# Patient Record
Sex: Female | Born: 1980 | Race: Black or African American | Hispanic: No | Marital: Married | Smoking: Former smoker
Health system: Southern US, Community
[De-identification: ages and names within clinical notes are randomized; demographics above are authoritative.]

## PROBLEM LIST (undated history)

## (undated) DIAGNOSIS — R7303 Prediabetes: Secondary | ICD-10-CM

## (undated) DIAGNOSIS — J811 Chronic pulmonary edema: Secondary | ICD-10-CM

## (undated) DIAGNOSIS — K219 Gastro-esophageal reflux disease without esophagitis: Secondary | ICD-10-CM

## (undated) DIAGNOSIS — M1990 Unspecified osteoarthritis, unspecified site: Secondary | ICD-10-CM

## (undated) DIAGNOSIS — M779 Enthesopathy, unspecified: Secondary | ICD-10-CM

## (undated) DIAGNOSIS — I10 Essential (primary) hypertension: Secondary | ICD-10-CM

## (undated) DIAGNOSIS — J189 Pneumonia, unspecified organism: Secondary | ICD-10-CM

## (undated) DIAGNOSIS — D649 Anemia, unspecified: Secondary | ICD-10-CM

## (undated) HISTORY — PX: CLOSED REDUCTION FINGER FRACTURE: SUR218

## (undated) SURGERY — Surgical Case
Anesthesia: *Unknown

---

## 1999-10-11 ENCOUNTER — Encounter: Payer: Self-pay | Admitting: Emergency Medicine

## 1999-10-11 ENCOUNTER — Emergency Department (HOSPITAL_COMMUNITY): Admission: EM | Admit: 1999-10-11 | Discharge: 1999-10-11 | Payer: Self-pay | Admitting: Emergency Medicine

## 2002-06-10 ENCOUNTER — Other Ambulatory Visit: Admission: RE | Admit: 2002-06-10 | Discharge: 2002-06-10 | Payer: Self-pay | Admitting: Gynecology

## 2002-11-13 ENCOUNTER — Inpatient Hospital Stay (HOSPITAL_COMMUNITY): Admission: AD | Admit: 2002-11-13 | Discharge: 2002-11-13 | Payer: Self-pay | Admitting: Gynecology

## 2002-12-16 ENCOUNTER — Inpatient Hospital Stay (HOSPITAL_COMMUNITY): Admission: AD | Admit: 2002-12-16 | Discharge: 2002-12-20 | Payer: Self-pay | Admitting: Gynecology

## 2002-12-18 ENCOUNTER — Encounter (INDEPENDENT_AMBULATORY_CARE_PROVIDER_SITE_OTHER): Payer: Self-pay

## 2003-01-29 ENCOUNTER — Other Ambulatory Visit: Admission: RE | Admit: 2003-01-29 | Discharge: 2003-01-29 | Payer: Self-pay | Admitting: Gynecology

## 2006-02-08 ENCOUNTER — Emergency Department (HOSPITAL_COMMUNITY): Admission: EM | Admit: 2006-02-08 | Discharge: 2006-02-08 | Payer: Self-pay | Admitting: Emergency Medicine

## 2006-09-11 ENCOUNTER — Inpatient Hospital Stay (HOSPITAL_COMMUNITY): Admission: AD | Admit: 2006-09-11 | Discharge: 2006-09-11 | Payer: Self-pay | Admitting: Obstetrics and Gynecology

## 2007-04-04 ENCOUNTER — Inpatient Hospital Stay (HOSPITAL_COMMUNITY): Admission: RE | Admit: 2007-04-04 | Discharge: 2007-04-07 | Payer: Self-pay | Admitting: Obstetrics and Gynecology

## 2007-04-11 ENCOUNTER — Inpatient Hospital Stay (HOSPITAL_COMMUNITY): Admission: AD | Admit: 2007-04-11 | Discharge: 2007-04-12 | Payer: Self-pay | Admitting: Obstetrics and Gynecology

## 2007-07-03 ENCOUNTER — Emergency Department (HOSPITAL_COMMUNITY): Admission: EM | Admit: 2007-07-03 | Discharge: 2007-07-03 | Payer: Self-pay | Admitting: Emergency Medicine

## 2008-03-31 ENCOUNTER — Emergency Department (HOSPITAL_COMMUNITY): Admission: EM | Admit: 2008-03-31 | Discharge: 2008-03-31 | Payer: Self-pay | Admitting: Emergency Medicine

## 2009-07-09 ENCOUNTER — Ambulatory Visit: Payer: Self-pay | Admitting: Family

## 2009-07-09 ENCOUNTER — Inpatient Hospital Stay (HOSPITAL_COMMUNITY): Admission: AD | Admit: 2009-07-09 | Discharge: 2009-07-09 | Payer: Self-pay | Admitting: Obstetrics & Gynecology

## 2009-08-20 ENCOUNTER — Inpatient Hospital Stay (HOSPITAL_COMMUNITY): Admission: AD | Admit: 2009-08-20 | Discharge: 2009-08-20 | Payer: Self-pay | Admitting: Obstetrics and Gynecology

## 2010-05-15 ENCOUNTER — Encounter: Payer: Self-pay | Admitting: Obstetrics & Gynecology

## 2010-05-25 ENCOUNTER — Other Ambulatory Visit: Payer: Self-pay | Admitting: Family Medicine

## 2010-05-25 DIAGNOSIS — E049 Nontoxic goiter, unspecified: Secondary | ICD-10-CM

## 2010-05-27 ENCOUNTER — Other Ambulatory Visit: Payer: Self-pay

## 2010-05-27 ENCOUNTER — Ambulatory Visit
Admission: RE | Admit: 2010-05-27 | Discharge: 2010-05-27 | Disposition: A | Discharge status: Home / Self Care | Payer: BC Managed Care – PPO | Source: Ambulatory Visit | Attending: Family Medicine | Admitting: Family Medicine

## 2010-05-27 DIAGNOSIS — E049 Nontoxic goiter, unspecified: Secondary | ICD-10-CM

## 2010-07-12 LAB — ABO/RH: ABO/RH(D): O POS

## 2010-07-12 LAB — CBC
HCT: 26 % — ABNORMAL LOW (ref 36.0–46.0)
MCHC: 33.3 g/dL (ref 30.0–36.0)
WBC: 10.8 10*3/uL — ABNORMAL HIGH (ref 4.0–10.5)

## 2010-07-17 LAB — URINE MICROSCOPIC-ADD ON

## 2010-07-17 LAB — WET PREP, GENITAL
Clue Cells Wet Prep HPF POC: NONE SEEN
Trich, Wet Prep: NONE SEEN
Yeast Wet Prep HPF POC: NONE SEEN

## 2010-07-17 LAB — URINALYSIS, ROUTINE W REFLEX MICROSCOPIC
Bilirubin Urine: NEGATIVE
Glucose, UA: NEGATIVE mg/dL
Ketones, ur: NEGATIVE mg/dL

## 2010-07-17 LAB — CBC
HCT: 31.2 % — ABNORMAL LOW (ref 36.0–46.0)
MCHC: 32.5 g/dL (ref 30.0–36.0)
WBC: 6.1 10*3/uL (ref 4.0–10.5)

## 2010-07-17 LAB — HCG, QUANTITATIVE, PREGNANCY: hCG, Beta Chain, Quant, S: 2331 m[IU]/mL — ABNORMAL HIGH (ref <5)

## 2010-07-17 LAB — GC/CHLAMYDIA PROBE AMP, GENITAL
Chlamydia, DNA Probe: NEGATIVE
GC Probe Amp, Genital: NEGATIVE

## 2010-07-17 LAB — POCT PREGNANCY, URINE: Preg Test, Ur: POSITIVE

## 2010-09-06 NOTE — Discharge Summary (Signed)
[name], Cheyenne Lyons NO.:  [id number]   MEDICAL RECORD NO.:  [id number]          PATIENT TYPE:  INP   LOCATION:  9106                          FACILITY:  [facility]   PHYSICIAN:  Gerrit Friends. Aldona Bar, M.D.   DATE OF BIRTH:  10-18-1980   DATE OF ADMISSION:  04/04/2007  DATE OF DISCHARGE:  04/07/2007                               DISCHARGE SUMMARY   DISCHARGE DIAGNOSES:  1. Term pregnancy delivered, 7-pound 8-ounce female infant, Apgars 8 and      9.  2. Blood type O positive.  3. Previous cesarean section.  4. Intra-abdominal adhesions.  5. Desire for elective sterilization.   PROCEDURE:  Repeat cesarean section and tubal sterilization procedure.   HISTORY:  This 30 year old gravida 2, now para 2 was admitted on  April 04, 2007, for repeat cesarean section and a tubal sterilization  procedure, having gone through a relatively uncomplicated pregnancy.  She had a previous cesarean section with her first pregnancy and was  desirous of a repeat and a permanent elective sterilization procedure.   Intraoperatively, omental adhesions were noted and there were lysed, and  the patient was delivered of a 7-pound 8-ounce female infant with Apgars  of 8 and 9.  Her tubal sterilization was unremarkable.  Her  postoperative course was benign.  Her discharge hemoglobin was 9.7 with  a white count of 6400 and a platelet count of 262,000.  On the morning  of December 14 she was ambulating well, tolerating a regular diet well,  her wound was clean and dry, she was breast-feeding and bottle-feeding,  vital signs were normal, and she was desirous of discharge.  Accordingly, her staples were removed and her wound was Steri-Stripped  with Benzoin.  She was given all appropriate instructions per discharge  brochure.  Discharge medications include vitamins - one a day, Feosol  capsules - one daily, Motrin 600 mg every 6 hours as needed for cramping  or pain, and Tylox one to two every 4-6 hours  as needed for more severe  pain.  She will return the office followup in approximately four weeks'  time or as needed.   CONDITION ON DISCHARGE:  Improved.      Gerrit Friends. Aldona Bar, M.D.  Electronically Signed     RMW/MEDQ  D:  04/07/2007  T:  04/08/2007  Job:  [id number]

## 2010-09-06 NOTE — Op Note (Signed)
NAMERAYVON, BRANDVOLD NO.:  [id number]   MEDICAL RECORD NO.:  [id number]          PATIENT TYPE:  INP   LOCATION:  9106                          FACILITY:  [facility]   PHYSICIAN:  Randye Lobo, M.D.   DATE OF BIRTH:  [birth year]   DATE OF PROCEDURE:  04/04/2007  DATE OF DISCHARGE:                               OPERATIVE REPORT   PREOPERATIVE DIAGNOSIS:  1. Intrauterine gestation at 99 plus 6 weeks.  2. History of prior cesarean section.  3. Unfavorable cervix.   POSTOPERATIVE DIAGNOSIS:  1. Intrauterine gestation at 44 plus 6 weeks.  2. History of prior cesarean section.  3. Unfavorable cervix.  4. Omental/abdominal wall adhesions.   PROCEDURE:  Repeat low segment transverse cesarean section with lysis of  adhesions.   SURGEON:  Conley Simmonds, MD.   ASSISTANT:  Miguel Aschoff, MD.   ANESTHESIA:  Spinal.   ESTIMATED BLOOD LOSS:  800 mL.   COMPLICATIONS:  None.   INDICATIONS FOR PROCEDURE:  The patient is a 30 year old, para 1,  African-American female with a history of a prior cesarean section who  presents now at term with an unfavorable cervix. A plan is made to  proceed with a repeat cesarean section after risks, benefits, and  alternatives are reviewed.   FINDINGS:  A viable female was delivered with Apgars of 8 at 1 minute and  9 at 5 minutes.  The newborn is vigorous at birth.  The placenta has a  normal insertion of a three-vessel cord.  The uterus, tubes and ovaries  are unremarkable.  There are adhesions between the omentum and the  anterior abdominal wall which are completely lysed.   SPECIMENS:  None.   PROCEDURE:  The patient was reidentified in the preoperative hold area.  The patient received Ancef 1 gram IV for antibiotic prophylaxis.  In the  operating room, a spinal anesthetic was administered.  The patient was  placed in the supine position with a left lateral tilt.  The abdomen was  sterilely prepped and a Foley catheter sterilely placed  inside the  bladder.  The patient was then sterilely draped.   A Pfannenstiel incision is created sharply with a scalpel along the line  of the patient's previous incision. The incision is carried down to the  fascia using monopolar cautery for hemostasis.  The fascia was then  incised in the midline with a scalpel and the incision was extended  bilaterally with the Mayo scissors.  The rectus muscles were dissected  off of the fascia superiorly and inferiorly.  Omentum was encountered  during the dissection of the rectus muscles superiorly.  The parietal  peritoneum was entered sharply after it was elevated with two hemostat  clamps.  The peritoneal incision was extended cranially and caudally.   Omental adhesions were encountered which were lysed with a combination  of sharp dissection and with monopolar cautery for hemostasis.   The lower uterine segment was exposed and the bladder retractor was  used.  The bladder flap was then sharply created.  A transverse lower  uterine segment incision was  created sharply with a scalpel and the  incision was extended bilaterally in an upward fashion using a bandage  scissors.  Membranes were ruptured with an Allis clamp.  The vertex was  elevated to the uterine incision.  Fundal pressure was applied and the  vertex and a Mityvac was then applied to the fetal vertex.  Proper  pressure was applied and there was dystocia noted from the anterior  abdominal wall at this time.  A bandage scissors was then used to  partially bisect the rectus muscles bilaterally.  This then allowed  delivery of the fetal vertex using the Mityvac.  The nares and mouth  were suctioned.  The remainder of the newborn was delivered.  The  newborn was noted to be vigorous.  The umbilical cord was doubly clamped  and cut and the newborn was carried over to the awaiting pediatricians.   The placenta was then manually extracted.  The uterus was exteriorized  at this time and  was wiped clean with a moistened lap pad.  The uterus  was closed with a single layer closure of #1 chromic which was performed  in a running locked layer.  The inferior aspect of the lower uterine  segment was noted to be quite thin and a double layer closure of the #1  chromic could therefore not to be performed.  There was some bleeding  noted along the mid portion of the incision and a series of figure-of-  eight sutures of #1 chromic were used to create hemostasis.   The uterus was then returned to the peritoneal cavity at this time which  was irrigated and suctioned.  The uterine incision was reexamined and  was noted to be hemostatic.   The omental adhesions to the abdominal wall peritoneum were then  addressed with double clamping and sharply dividing some segments of the  peritoneum and then using free ties of 2-0 Vicryl.  The remainder of the  adhesions were lysed using monopolar cautery which created good  hemostasis.   With complete lysis of adhesions, the anterior abdominal wall was then  closed.  2-0 Vicryl was used to close the parietal peritoneum by placing  the suture in a running fashion.  The rectus muscles were reconstructed  using figure-of-eight sutures of #0 Vicryl.  The rectus muscles were  then brought together in the midline using interrupted sutures of #0  Vicryl.  The fascia was closed by placing a running suture of #0 Vicryl.  The subcutaneous layer was irrigated and suctioned and made hemostatic  with monopolar cautery.  The skin was closed with staples and a sterile  bandage was placed over the incision.   This concluded the patient's procedure.  There were no complications.  All needle, instrument, and sponge counts were correct.  The patient is  escorted to the recovery room in stable and awake condition.      Randye Lobo, M.D.  Electronically Signed    BES/MEDQ  D:  04/05/2007  T:  04/07/2007  Job:  [id number]

## 2010-09-06 NOTE — Op Note (Signed)
Cheyenne Lyons, Cheyenne Lyons NO.:  Lyons   MEDICAL RECORD NO.:  Lyons          PATIENT TYPE:  EMS   LOCATION:  ED                           FACILITY:  [facility]   PHYSICIAN:  Cheyenne Ano. Gramig, Cheyenne LyonsDATE OF BIRTH:  1980-09-24   DATE OF PROCEDURE:  03/31/2008  DATE OF DISCHARGE:                               OPERATIVE REPORT   HISTORY OF PRESENT ILLNESS:  Cheyenne Lyons is a very pleasant 30 year old  female who presents to [facility] Emergency Room today, March 31, 2008 after sustaining a crush injury to the left middle finger, distal  tip.  She states that this was caught in her car door earlier this  morning while dropping her child off to school.  She presented to the  emergency room for evaluation.  We were contacted by the emergency room  physicians for hand consult, given the nature of her injury.   The patient's tetanus shot was updated in the emergency room.   Examination of the left middle finger showed she had significant soft  tissue swelling, partial nail plate avulsion, 100% subungual hematoma  and deformity present.  Her radiographs revealed a comminuted distal-tip  fracture, extra-articular, about the left middle finger.  There was a  prior left index finger distal tip fracture she sustained in 2007, which  was noted radiographically.   We discussed with the patient at length, given the nature of her upper  extremity predicament and open fracture, the need for I and D, and  repair of structures as necessary.  After obtaining verbal consent, the  patient underwent:  1)  Digital block, left middle finger.  2)  I and D  of skin, subcutaneous tissue, and bone.  3)  Treatment of an open  fracture, left middle finger distal tip.  4)  Nail plate removal.  5)  Nail bed repair.   The patient tolerated the procedure well.  There were no complications.   We have discussed diligent elevation, keeping her dressings clean, dry,  and intact at all times and not  removing these.  We have dispensed  Vicodin 5/500 1-2 p.o. q.4-6h. p.r.n. pain to take exclusive of the  Hycodan that she is finishing for influenza-type symptoms.  In addition,  we have discussed taking Keflex for antibiotic prophylaxis.  This will  be 500 mg 1 p.o. q.i.d., Peri-Colace 100 mg 1 p.o. b.i.d., and vitamin C  500 mg 1 p.o. b.i.d.   We have asked her to follow up with our therapist initially for splint  application in approximately 1 week's time.  She will call [phone] to  see them.  She will follow up with Dr. Amanda Lyons in approximately 10 days  to 2 weeks.  She is to call [phone] for an appointment, questions, or  concerns.  All questions were encouraged and answered.     Cheyenne Lyons, P.A.-C.      Cheyenne Lyons, M.D.  Electronically Signed   BB/MEDQ  D:  03/31/2008  T:  03/31/2008  Job:  [id number]

## 2010-09-09 NOTE — Op Note (Signed)
NAME:  Cheyenne Lyons, Cheyenne Lyons                       ACCOUNT NO.:  [id number]   MEDICAL RECORD NO.:  [id number]                   PATIENT TYPE:  INP   LOCATION:  9133                                 FACILITY:  [facility]   PHYSICIAN:  Ivor Costa. Farrel Gobble, M.D.              DATE OF BIRTH:  1981/02/21   DATE OF PROCEDURE:  12/17/2002  DATE OF DISCHARGE:                                 OPERATIVE REPORT   PREOPERATIVE DIAGNOSIS:  Arrest of dilation and descent, recurrent  variables.   POSTOPERATIVE DIAGNOSIS:  Arrest of dilation and descent, recurrent  variables, occult cord.   PROCEDURE:  Primary cesarean section, low flap, transverse.   SURGEON:  Ivor Costa. Farrel Gobble, M.D.   ASSISTANTMarcial Pacas P. Fontaine, M.D.   ANESTHESIA:  Epidural.   ESTIMATED BLOOD LOSS:  500 mL.   FINDINGS:  Viable female infant acyclitic in a vertex presentation, Apgars 7  and 9, pH 7.27, there was a occult cord around the right shoulder.  Normal  tubes and ovaries.   PATHOLOGY:  Placenta.   PROCEDURE:  The patient was taken to the operating room and placed in the  supine position with left lateral displacement, prepped and draped in the  usual sterile fashion.  After adequate anesthesia was insured, a  Pfannenstiel skin incision was made with the scalpel and carried through the  underlying layer of fascia with electrocautery.  The fascia was scored in  the midline and the incision extended laterally with the Mayo scissors.  The  inferior aspect of the fascial incision was grasped with Kochers, underlying  rectus muscles were dissected off with blunt and sharp dissection.  In a  similar fashion, the superior aspect of the incision was grasped with  Kochers and the underlying rectus muscles were dissected off.  The rectus  muscles were separated in the midline.  The peritoneum was identified and  entered sharply.  The peritoneal incision was then extended superiorly and  inferiorly with good visualization of the  underlying bowel and bladder.  The  bladder blade was inserted.  The vesicouterine peritoneum was identified,  tented up and entered sharply.  The incision was extended laterally, the  bladder flap was created digitally.  The bladder blade was then reinserted  and the lower uterine segment incised in a transverse fashion with a  scalpel.  The infant was delivered from the vertex presentation.  The occult  cord was able to be reduced prior to delivery of the remainder of the  infant.  The cord was clamped and cut and the baby was handed off to the  awaiting pediatrician.  Cord bloods were obtained as was pH.  The uterus was  massaged and the placenta was allowed to separate naturally.  The uterus was  then cleared of all clots and debris.  The uterine incision was repaired  with a running locked layer of 0 chromic and hemostasis was assured.  The  pelvis was then copiously irrigated with warm saline which confirmed  hemostasis of the incision.  The adnexa were inspected and noted to be  unremarkable.  Reinspection underneath the bladder flap, peritoneum and  muscle was also assured.  The peritoneum was closed  with 0 Vicryl in a running fashion.  The subcutaneous tissue was irrigated  and the skin was closed with staples.  The patient tolerated the procedure  well.  Sponge, lap, and needle counts were correct x 2.  She was given Ancef  interoperatively.                                               Ivor Costa. Farrel Gobble, M.D.    THL/MEDQ  D:  12/17/2002  T:  12/17/2002  Job:  [id number]

## 2010-09-09 NOTE — Discharge Summary (Signed)
   NAME:  Cheyenne Lyons, Cheyenne Lyons                       ACCOUNT NO.:  [id number]   MEDICAL RECORD NO.:  [id number]                   PATIENT TYPE:  INP   LOCATION:  9133                                 FACILITY:  [facility]   PHYSICIAN:  Juan H. Lily Peer, M.D.             DATE OF BIRTH:  05-May-1980   DATE OF ADMISSION:  12/16/2002  DATE OF DISCHARGE:  12/20/2002                                 DISCHARGE SUMMARY   DISCHARGE DIAGNOSES:  1. Intrauterine pregnancy 37 weeks, delivered.  2. Arrest of dilatation and descent.  3. Recurrent variable decelerations.  4. Occult cord.  5. Status post primary cesarean section low flap transverse by Dr. Douglass Rivers on December 17, 2002.   HOSPITAL COURSE:  This is a 22-years-of-age female gravida 1 para 0 with an  EDC of January 04, 2003.  Prenatal course had been uncomplicated.   HOSPITAL COURSE:  On December 16, 2002 the patient presented with gross  rupture of membranes since approximately 1 p.m.  Her group B strep was not  available as of yet; therefore the patient was begun on IV antibiotics for  prophylaxis.  Cervix was closed on admission.  The patient was admitted and  monitored.  Cervidil was given that p.m. and Pitocin was started the  following a.m.  Subsequently, however, the patient developed recurrent  variables and the diagnosis of arrest of dilation and descent with recurrent  variables was made and therefore the patient underwent a primary cesarean  section low flat transverse by Dr. Douglass Rivers on December 17, 2002.  There  was found to be an occult cord.  The patient underwent delivery of a female,  Apgars of 7 and 9, weight of 7 pounds 6 ounces.  Postpartum the patient  remained afebrile, voiding, stable condition.  She was discharged to home on  December 20, 2002 and given [facility] Gy[facility] postpartum instructions and  postpartum booklet.   ACCESSORY CLINICAL FINDINGS/LABORATORY DATA:  The patient is O positive,  rubella immune   On December 18, 2002 hemoglobin was 9.4.   DISPOSITION:  The patient is discharged home.  Given a prescription for  Tylox to take p.r.n. pain, iron daily.  Follow up in six weeks.     Susa Loffler, P.A.                    Juan H. Lily Peer, M.D.    TSG/MEDQ  D:  01/09/2003  T:  01/10/2003  Job:  [id number]

## 2010-09-09 NOTE — H&P (Signed)
   NAME:  Cheyenne Lyons                       ACCOUNT NO.:  [id number]   MEDICAL RECORD NO.:  [id number]                   PATIENT TYPE:  INP   LOCATION:  9164                                 FACILITY:  [facility]   PHYSICIAN:  Timothy P. Fontaine, M.D.           DATE OF BIRTH:  Aug 23, 1980   DATE OF ADMISSION:  12/16/2002  DATE OF DISCHARGE:                                HISTORY & PHYSICAL   CHIEF COMPLAINT:  Ruptured membranes.   HISTORY OF PRESENT ILLNESS:  This is a 30 year old, G1, P0 at [redacted] weeks  gestation who enters with a history of gross rupture of membranes since  approximately 1 p.m.  She is not having any significant contractions.  Her  prenatal course has been uncomplicated.  For remainder of her history, see  her [place].   PHYSICAL EXAMINATION:  HEENT:  Normal.  LUNGS:  Clear.  CARDIAC:  Regular rate.  No rubs, murmurs or gallops.  ABDOMEN:  Gravid, vertex fetus.  Positive fetal heart tones.  PELVIC:  Gross rupture of membranes.  Fluid is clear.  Cervix is long and  closed.  Vertex presentation on bimanual.   ASSESSMENT/PLAN:  This is a 37 week, gross rupture of membranes, no overt  labor, cervix is closed.  We will admit and monitor.  If without overt  labor, we will begin induction with Cervidil in the p.m. and Pitocin in the  a.m.  The patient's beta Streptococcus culture was done yesterday.  Given  that it is not available yet and as an anticipated prolonged labor, we will  go ahead and cover with antibiotic prophylaxis.  The plan was discussed with  the patient who understood and accepted.                                               Timothy P. Audie Box, M.D.    TPF/MEDQ  D:  12/16/2002  T:  12/16/2002  Job:  [id number]

## 2010-09-26 ENCOUNTER — Other Ambulatory Visit: Payer: Self-pay | Admitting: Obstetrics and Gynecology

## 2011-01-27 LAB — URIC ACID: Uric Acid, Serum: 9 — ABNORMAL HIGH

## 2011-01-27 LAB — COMPREHENSIVE METABOLIC PANEL
ALT: 18
AST: 21
Albumin: 2.8 — ABNORMAL LOW
CO2: 26
Calcium: 9.4
Creatinine, Ser: 0.82
GFR calc non Af Amer: >60
Glucose, Bld: 90
Potassium: 4.4
Sodium: 142

## 2011-01-27 LAB — URINE CULTURE: Colony Count: >=100000

## 2011-01-27 LAB — URINALYSIS, ROUTINE W REFLEX MICROSCOPIC
Bilirubin Urine: NEGATIVE
Glucose, UA: NEGATIVE
Ketones, ur: NEGATIVE
Nitrite: NEGATIVE
Urobilinogen, UA: 0.2
pH: 6

## 2011-01-27 LAB — DIFFERENTIAL
Basophils Absolute: 0
Basophils Relative: 0
Eosinophils Relative: 2
Neutro Abs: 5.1

## 2011-01-27 LAB — CBC
MCHC: 33.6
RBC: 3.55 — ABNORMAL LOW

## 2011-01-27 LAB — LACTATE DEHYDROGENASE: LDH: 261 — ABNORMAL HIGH

## 2011-01-30 LAB — CBC
HCT: 32.1 — ABNORMAL LOW
Hemoglobin: 10.9 — ABNORMAL LOW
MCHC: 33.7
MCV: 91.5
Platelets: 262
RDW: 13.9
WBC: 7.4

## 2011-01-30 LAB — RPR: RPR Ser Ql: NONREACTIVE

## 2011-01-30 LAB — COMPREHENSIVE METABOLIC PANEL
Alkaline Phosphatase: 114
BUN: 5 — ABNORMAL LOW
Chloride: 108
Creatinine, Ser: 0.59
GFR calc non Af Amer: >60
Glucose, Bld: 75
Potassium: 3.6
Total Bilirubin: 0.3

## 2011-01-30 LAB — URINALYSIS, ROUTINE W REFLEX MICROSCOPIC
Bilirubin Urine: NEGATIVE
Ketones, ur: NEGATIVE
Specific Gravity, Urine: 1.01
Urobilinogen, UA: 0.2
pH: 6.5

## 2011-01-30 LAB — URINE MICROSCOPIC-ADD ON

## 2011-01-30 LAB — LACTATE DEHYDROGENASE: LDH: 128

## 2011-04-23 ENCOUNTER — Ambulatory Visit (INDEPENDENT_AMBULATORY_CARE_PROVIDER_SITE_OTHER): Payer: BC Managed Care – PPO

## 2011-04-23 DIAGNOSIS — R1032 Left lower quadrant pain: Secondary | ICD-10-CM

## 2011-11-02 ENCOUNTER — Other Ambulatory Visit: Payer: Self-pay | Admitting: Obstetrics and Gynecology

## 2012-03-06 ENCOUNTER — Encounter (INDEPENDENT_AMBULATORY_CARE_PROVIDER_SITE_OTHER): Payer: Self-pay | Admitting: General Surgery

## 2012-03-08 ENCOUNTER — Ambulatory Visit (INDEPENDENT_AMBULATORY_CARE_PROVIDER_SITE_OTHER): Payer: BC Managed Care – PPO | Admitting: General Surgery

## 2012-03-08 ENCOUNTER — Encounter (INDEPENDENT_AMBULATORY_CARE_PROVIDER_SITE_OTHER): Payer: Self-pay | Admitting: General Surgery

## 2012-03-08 VITALS — BP 152/70 | HR 72 | Temp 97.7°F | Resp 20 | Ht 65.0 in | Wt 337.2 lb

## 2012-03-08 DIAGNOSIS — L905 Scar conditions and fibrosis of skin: Secondary | ICD-10-CM

## 2012-03-08 NOTE — Patient Instructions (Signed)
The small nodule on the left side of your old C-section scar is most likely old scar tissue. It is a little bit tender, but you stated that has been improving. I cannot push this back in, but there is a small chance this could be an early hernia.  We have discussed and advised a CAT scan to investigate this further if you would desire. You have stated she would like to go home and think about whether to do the CAT scan or not.  Please call Dr. Jacinto Halim office if you decides to have the CAT scan. Also call Dr. Jacinto Halim office if this area becomes more painful or enlarged.

## 2012-03-08 NOTE — Progress Notes (Signed)
Patient ID: Cheyenne Lyons, female   DOB: [birth year], 31 y.o.   MRN: [id number]  Chief Complaint  Patient presents with  . New Evaluation    eval LLQ pain    HPI Cheyenne Lyons is a 31 y.o. female.  She is referred by Dr. Henderson Cloud for evaluation of a tender nodule in her old C-section scar.  This patient has had 2 cesarean sections. One year ago she had a vaginal ultrasound and was told that she had an ovarian cyst and was placed on birth control pills. She has a one-year history of a tender lump on the left side of her C-section scar. This is constant, it does not reduce. The tenderness is actually less now than it used to be.  Recent vaginal ultrasound shows no abnormalities according to the patient. She has no history of endometriosis. There are no GI symptoms. HPI  History reviewed. No pertinent past medical history.  Past Surgical History  Procedure Date  . Closed reduction finger fracture   . Cesarean section     x2    Family History  Problem Relation Age of Onset  . Cancer Maternal Aunt     ovarian  . Cancer Maternal Uncle     throat    Social History History  Substance Use Topics  . Smoking status: Current Every Day Smoker -- 0.2 packs/day  . Smokeless tobacco: Not on file  . Alcohol Use: No    No Known Allergies  Current Outpatient Prescriptions  Medication Sig Dispense Refill  . diphenhydrAMINE (BENADRYL) 25 MG tablet Take 25 mg by mouth every 6 (six) hours as needed.      Marland Kitchen ibuprofen (ADVIL,MOTRIN) 100 MG tablet Take 100 mg by mouth every 6 (six) hours as needed.      Marland Kitchen GIANVI 3-0.02 MG tablet         Review of Systems Review of Systems  Constitutional: Negative for fever, chills and unexpected weight change.  HENT: Negative for hearing loss, congestion, sore throat, trouble swallowing and voice change.   Eyes: Negative for visual disturbance.  Respiratory: Negative for cough and wheezing.   Cardiovascular: Negative for chest pain, palpitations and leg  swelling.  Gastrointestinal: Negative for nausea, vomiting, abdominal pain, diarrhea, constipation, blood in stool, abdominal distention and anal bleeding.  Genitourinary: Negative for hematuria, vaginal bleeding and difficulty urinating.  Musculoskeletal: Negative for arthralgias.  Skin: Negative for rash and wound.  Neurological: Negative for seizures, syncope and headaches.  Hematological: Negative for adenopathy. Does not bruise/bleed easily.  Psychiatric/Behavioral: Negative for confusion.    Blood pressure 152/70, pulse 72, temperature 97.7 F (36.5 C), temperature source Temporal, resp. rate 20, height 5\' 5"  (1.651 m), weight 337 lb 3.2 oz (152.953 kg).  Physical Exam Physical Exam  Constitutional: She is oriented to person, place, and time. She appears well-developed and well-nourished. No distress.       BMI 56  HENT:  Head: Normocephalic and atraumatic.  Nose: Nose normal.  Mouth/Throat: No oropharyngeal exudate.  Eyes: Conjunctivae normal and EOM are normal. Pupils are equal, round, and reactive to light. Left eye exhibits no discharge. No scleral icterus.  Neck: Neck supple. No JVD present. No tracheal deviation present. No thyromegaly present.  Cardiovascular: Normal rate, regular rhythm, normal heart sounds and intact distal pulses.   No murmur heard. Pulmonary/Chest: Effort normal and breath sounds normal. No respiratory distress. She has no wheezes. She has no rales. She exhibits no tenderness.  Abdominal: Soft. Bowel sounds are normal. She  exhibits no distension and no mass. There is no tenderness. There is no rebound and no guarding.         Examined supine and standing. Small, 1.5 cm area of tissue thickening on the left-sided directly beneath her transverse Pfannenstiel incision. Not reducible. Minimally tender. Present even when supine No inflammatory changes of the skin.  Musculoskeletal: She exhibits no edema and no tenderness.  Lymphadenopathy:    She has no  cervical adenopathy.  Neurological: She is alert and oriented to person, place, and time. She exhibits normal muscle tone. Coordination normal.  Skin: Skin is warm. No rash noted. She is not diaphoretic. No erythema. No pallor.  Psychiatric: She has a normal mood and affect. Her behavior is normal. Judgment and thought content normal.    Data Reviewed Dr. Kittie Plater offixce notes  Assessment    Tender nodule in Pfannenstiel scar. Suspect this is simply scar tissue or fibrosis. Less likely hernia. Doubt endometrioma. Symptoms have been improving over the past few months No evidence of inguinal hernia Morbid obesity    Plan    I discussed the differential diagnosis with the patient. I advised to CT scan for definitive investigation. She is not sure that she wants to go through a CT scan. She is going to go home and think about that and call me back.  I've asked her to return to see me if this area enlarges or becomes more painful.  Otherwise see me when necessary       Angelia Mould. Derrell Lolling, M.D., [facility] General and Minimally invasive Surgery Breast and Colorectal Surgery Office:   [phone] Pager:   [phone]  03/08/2012, 5:30 PM

## 2012-04-23 ENCOUNTER — Telehealth (INDEPENDENT_AMBULATORY_CARE_PROVIDER_SITE_OTHER): Payer: Self-pay | Admitting: General Surgery

## 2012-04-23 ENCOUNTER — Other Ambulatory Visit (INDEPENDENT_AMBULATORY_CARE_PROVIDER_SITE_OTHER): Payer: Self-pay | Admitting: General Surgery

## 2012-04-23 DIAGNOSIS — L905 Scar conditions and fibrosis of skin: Secondary | ICD-10-CM

## 2012-04-23 NOTE — Telephone Encounter (Signed)
Called patient to advise CT was scheduled with [facility]. Patient given direct number to call in order to set up the appointment based on what works for her schedule. Patient advised to please call back to provide the date she obtained so that I can look out for the results and give to Dr. Derrell Lolling for review. Advised patient that once he reviews he will determine if she needs to come back in to be seen in office or not. Patient agreed.

## 2012-04-25 ENCOUNTER — Ambulatory Visit
Admission: RE | Admit: 2012-04-25 | Discharge: 2012-04-25 | Disposition: A | Discharge status: Home / Self Care | Payer: BC Managed Care – PPO | Source: Ambulatory Visit | Attending: General Surgery | Admitting: General Surgery

## 2012-04-25 MED ORDER — IOHEXOL 300 MG/ML  SOLN
125.0000 mL | Freq: Once | INTRAMUSCULAR | Status: AC | PRN
  Administered 2012-04-25: 125 mL via INTRAVENOUS

## 2012-05-14 ENCOUNTER — Ambulatory Visit (INDEPENDENT_AMBULATORY_CARE_PROVIDER_SITE_OTHER): Payer: BC Managed Care – PPO | Admitting: General Surgery

## 2012-05-14 ENCOUNTER — Encounter (INDEPENDENT_AMBULATORY_CARE_PROVIDER_SITE_OTHER): Payer: Self-pay | Admitting: General Surgery

## 2012-05-14 VITALS — BP 128/72 | HR 74 | Temp 98.4°F | Resp 18 | Ht 65.0 in | Wt 341.0 lb

## 2012-05-14 DIAGNOSIS — R209 Unspecified disturbances of skin sensation: Secondary | ICD-10-CM

## 2012-05-14 DIAGNOSIS — L7682 Other postprocedural complications of skin and subcutaneous tissue: Secondary | ICD-10-CM

## 2012-05-14 NOTE — Patient Instructions (Signed)
The CT scan shows that you do not have a hernia. There is a small area of tissue thickening at the left end of the incision. This may simply be scar tissue from her previous C-section. There is a small chance this could be an endometrioma.  You do not need any surgical intervention for this at this time.  Please return to your gynecologist to discuss the cyclical nature of pain related to this.  Return to see Dr. Derrell Lolling if any further surgical problems arise.

## 2012-05-14 NOTE — Progress Notes (Signed)
Patient ID: Cheyenne Lyons, female   DOB: [birth year], 32 y.o.   MRN: [id number] History this patient underwent CT scanning of her abdomen and pelvis  recently. This was to evaluate a tender nodule in the left end of her C-section scar. The CT scan shows a 1.8 cm area of tissue thickening related to the left end of the C-section scar. There is no hernia. Fibrosis and endometrioma were suggested in the differential. The patient states the pain is not that bad but it does hurt to palpate it. She says it is a little bit worse during her menstrual cycle. She has no history of endometriosis.  Exam: Patient looks well. Husband is with her. Morbidly obese. Pfannenstiel scar well-healed. Slight tenderness and slight thickening at the far left edge of the incision. No hernia detected  Assessment: Incisional pain and Pfannenstiel scar. No evidence of hernia. This may simply be due to fibrosis and scar tissue or less likely, and endometrioma  Plan: There is no indication for general surgical intervention at this point Return to see her gynecologist for discussion of whether this might be an endometrioma or not   Angelia Mould. Derrell Lolling, M.D., [facility] General and Minimally invasive Surgery Breast and Colorectal Surgery Office:   [phone] Pager:   [phone]

## 2013-01-08 ENCOUNTER — Other Ambulatory Visit (HOSPITAL_COMMUNITY): Payer: Self-pay | Admitting: Obstetrics and Gynecology

## 2013-01-08 ENCOUNTER — Other Ambulatory Visit: Payer: Self-pay | Admitting: Obstetrics and Gynecology

## 2013-01-08 DIAGNOSIS — Z3689 Encounter for other specified antenatal screening: Secondary | ICD-10-CM

## 2013-01-08 LAB — OB RESULTS CONSOLE RPR: RPR: NONREACTIVE

## 2013-01-08 LAB — OB RESULTS CONSOLE RUBELLA ANTIBODY, IGM: RUBELLA: IMMUNE

## 2013-01-08 LAB — OB RESULTS CONSOLE ABO/RH: RH Type: POSITIVE

## 2013-01-08 LAB — OB RESULTS CONSOLE ANTIBODY SCREEN: ANTIBODY SCREEN: NEGATIVE

## 2013-01-08 LAB — OB RESULTS CONSOLE HIV ANTIBODY (ROUTINE TESTING): HIV: NONREACTIVE

## 2013-01-08 LAB — OB RESULTS CONSOLE HEPATITIS B SURFACE ANTIGEN: HEP B S AG: NEGATIVE

## 2013-01-24 ENCOUNTER — Ambulatory Visit (HOSPITAL_COMMUNITY)
Admission: RE | Admit: 2013-01-24 | Discharge: 2013-01-24 | Disposition: A | Discharge status: Home / Self Care | Payer: BC Managed Care – PPO | Source: Ambulatory Visit | Attending: Obstetrics and Gynecology | Admitting: Obstetrics and Gynecology

## 2013-01-24 ENCOUNTER — Other Ambulatory Visit (HOSPITAL_COMMUNITY): Payer: Self-pay | Admitting: Obstetrics and Gynecology

## 2013-01-24 DIAGNOSIS — E669 Obesity, unspecified: Secondary | ICD-10-CM | POA: Insufficient documentation

## 2013-01-24 DIAGNOSIS — Z3689 Encounter for other specified antenatal screening: Secondary | ICD-10-CM

## 2013-01-24 DIAGNOSIS — O358XX0 Maternal care for other (suspected) fetal abnormality and damage, not applicable or unspecified: Secondary | ICD-10-CM | POA: Insufficient documentation

## 2013-01-24 DIAGNOSIS — Z363 Encounter for antenatal screening for malformations: Secondary | ICD-10-CM | POA: Insufficient documentation

## 2013-01-24 DIAGNOSIS — O34219 Maternal care for unspecified type scar from previous cesarean delivery: Secondary | ICD-10-CM | POA: Insufficient documentation

## 2013-01-24 DIAGNOSIS — Z1389 Encounter for screening for other disorder: Secondary | ICD-10-CM | POA: Insufficient documentation

## 2013-01-28 ENCOUNTER — Other Ambulatory Visit (HOSPITAL_COMMUNITY): Payer: Self-pay | Admitting: Obstetrics and Gynecology

## 2013-01-28 DIAGNOSIS — O358XX0 Maternal care for other (suspected) fetal abnormality and damage, not applicable or unspecified: Secondary | ICD-10-CM

## 2013-01-28 DIAGNOSIS — O34219 Maternal care for unspecified type scar from previous cesarean delivery: Secondary | ICD-10-CM

## 2013-01-28 DIAGNOSIS — E669 Obesity, unspecified: Secondary | ICD-10-CM

## 2013-03-07 ENCOUNTER — Ambulatory Visit (HOSPITAL_COMMUNITY)
Admission: RE | Admit: 2013-03-07 | Discharge: 2013-03-07 | Disposition: A | Discharge status: Home / Self Care | Payer: BC Managed Care – PPO | Source: Ambulatory Visit | Attending: Obstetrics and Gynecology | Admitting: Obstetrics and Gynecology

## 2013-03-07 ENCOUNTER — Ambulatory Visit (HOSPITAL_COMMUNITY): Admission: RE | Admit: 2013-03-07 | Payer: BC Managed Care – PPO | Source: Ambulatory Visit

## 2013-03-07 DIAGNOSIS — O34219 Maternal care for unspecified type scar from previous cesarean delivery: Secondary | ICD-10-CM | POA: Insufficient documentation

## 2013-03-07 DIAGNOSIS — E669 Obesity, unspecified: Secondary | ICD-10-CM | POA: Insufficient documentation

## 2013-03-07 DIAGNOSIS — O358XX0 Maternal care for other (suspected) fetal abnormality and damage, not applicable or unspecified: Secondary | ICD-10-CM

## 2013-04-12 ENCOUNTER — Ambulatory Visit (INDEPENDENT_AMBULATORY_CARE_PROVIDER_SITE_OTHER): Payer: BC Managed Care – PPO | Admitting: Family Medicine

## 2013-04-12 VITALS — BP 122/82 | HR 100 | Temp 99.1°F | Resp 18 | Ht 66.0 in | Wt 361.0 lb

## 2013-04-12 DIAGNOSIS — R042 Hemoptysis: Secondary | ICD-10-CM

## 2013-04-12 DIAGNOSIS — H9209 Otalgia, unspecified ear: Secondary | ICD-10-CM

## 2013-04-12 DIAGNOSIS — J209 Acute bronchitis, unspecified: Secondary | ICD-10-CM

## 2013-04-12 DIAGNOSIS — H9202 Otalgia, left ear: Secondary | ICD-10-CM

## 2013-04-12 DIAGNOSIS — J029 Acute pharyngitis, unspecified: Secondary | ICD-10-CM

## 2013-04-12 DIAGNOSIS — J329 Chronic sinusitis, unspecified: Secondary | ICD-10-CM

## 2013-04-12 MED ORDER — AZITHROMYCIN 250 MG PO TABS: ORAL_TABLET | ORAL | Status: DC

## 2013-04-12 MED ORDER — HYDROCODONE-HOMATROPINE 5-1.5 MG/5ML PO SYRP: 5.0000 mL | ORAL_SOLUTION | Freq: Three times a day (TID) | ORAL | Status: DC | PRN

## 2013-04-12 NOTE — Progress Notes (Addendum)
Patient ID: Cheyenne Lyons MRN: [id number], DOB: [birth year], 32 y.o. Date of Encounter: 04/12/2013, 1:32 PM  Primary Physician: Loney Laurence, MD  Chief Complaint:  Chief Complaint  Patient presents with   Sore Throat    x7 days    Nasal Congestion   Otalgia    HPI: 32 y.o. year old female presents with 7 day history of gradual onset, gradually worsening sore throat. She also lists a mild productive cough consisting of Hemoptysis, nasal congestion, HA, and otalgia as associated symptoms. She denies rhinorrhea, sinus pressure, fever, chest congestion, or chills. Normal hearing. No GI complaints. Able to swallow saliva, but hurts to do so. Decreased appetite secondary to sore throat. She says her daughter was initially sick, and she believes she developed her symptoms from her. She reports currently being 33 months pregnant.  She currently works at [employer] in Clinical biochemist   History reviewed. No pertinent past medical history.   Home Meds: Prior to Admission medications   Medication Sig Start Date End Date Taking? Authorizing Provider  acetaminophen (TYLENOL) 500 MG tablet Take 500 mg by mouth every 6 (six) hours as needed.   Yes Historical Provider, MD  IRON PO Take by mouth.   Yes Historical Provider, MD  Prenatal Vit-Fe Fumarate-FA (PRENATAL MULTIVITAMIN) TABS tablet Take 1 tablet by mouth daily at 12 noon.   Yes Historical Provider, MD  diphenhydrAMINE (BENADRYL) 25 MG tablet Take 25 mg by mouth every 6 (six) hours as needed.    Historical Provider, MD  Helen Hashimoto 3-0.02 MG tablet  02/28/12   Historical Provider, MD  ibuprofen (ADVIL,MOTRIN) 100 MG tablet Take 100 mg by mouth every 6 (six) hours as needed.    Historical Provider, MD    Allergies: No Known Allergies  History   Social History   Marital Status: Married    Spouse Name: N/A    Number of Children: N/A   Years of Education: N/A   Occupational History   Not on file.   Social History Main Topics     Smoking status: Former Smoker -- 0.25 packs/day   Smokeless tobacco: Not on file   Alcohol Use: No   Drug Use: No   Sexual Activity: Not on file   Other Topics Concern   Not on file   Social History Narrative   No narrative on file     Review of Systems: Constitutional: negative for chills, fever, night sweats or weight changes HEENT: see above Cardiovascular: negative for chest pain or palpitations Respiratory: negative for hemoptysis, wheezing, or shortness of breath Abdominal: negative for abdominal pain, nausea, vomiting or diarrhea Dermatological: negative for rash Neurologic: negative for headache   Physical Exam: Blood pressure 122/82, pulse 100, temperature 99.1 F (37.3 C), temperature source Oral, resp. rate 18, height 5\' 6"  (1.676 m), weight 361 lb (163.749 kg), SpO2 98.00%., Body mass index is 58.29 kg/(m^2). General: Well developed, well nourished, in no acute distress. Head: Normocephalic, atraumatic, eyes without discharge, sclera non-icteric, nares are patent. Bilateral auditory canals clear, TM's are without perforation, pearly grey with reflective cone of light bilaterally. No sinus TTP. Oral cavity moist, dentition normal. Posterior pharynx with post nasal drip and mild erythema. No peritonsillar abscess or tonsillar exudate. Neck: Supple. No thyromegaly. Full ROM. No lymphadenopathy. Lungs: Clear bilaterally to auscultation without wheezes, rales, or rhonchi. Breathing is unlabored. Heart: RRR with S1 S2. No murmurs, rubs, or gallops appreciated. Abdomen: Soft, non-tender, non-distended with normoactive bowel sounds. No hepatomegaly. No rebound/guarding.  No obvious abdominal masses. Msk:  Strength and tone normal for age. Extremities: No clubbing or cyanosis. No edema. Neuro: Alert and oriented X 3. Moves all extremities spontaneously. CNII-XII grossly in tact. Psych:  Responds to questions appropriately with a normal affect.    ASSESSMENT AND PLAN:   32 y.o. year old female with Acute bronchitis  Sinusitis  Acute pharyngitis  Hemoptysis  Otalgia of left ear  z pak and hydromet ordered - -Tylenol/Motrin prn -Rest/fluids -RTC precautions -RTC 3-5 days if no improvement  Signed, Elvina Sidle, MD 04/12/2013 1:32 PM

## 2013-04-12 NOTE — Patient Instructions (Signed)
Sinusitis Sinusitis is redness, soreness, and swelling (inflammation) of the paranasal sinuses. Paranasal sinuses are air pockets within the bones of your face (beneath the eyes, the middle of the forehead, or above the eyes). In healthy paranasal sinuses, mucus is able to drain out, and air is able to circulate through them by way of your nose. However, when your paranasal sinuses are inflamed, mucus and air can become trapped. This can allow bacteria and other germs to grow and cause infection. Sinusitis can develop quickly and last only a short time (acute) or continue over a long period (chronic). Sinusitis that lasts for more than 12 weeks is considered chronic.  CAUSES  Causes of sinusitis include:  Allergies.  Structural abnormalities, such as displacement of the cartilage that separates your nostrils (deviated septum), which can decrease the air flow through your nose and sinuses and affect sinus drainage.  Functional abnormalities, such as when the small hairs (cilia) that line your sinuses and help remove mucus do not work properly or are not present. SYMPTOMS  Symptoms of acute and chronic sinusitis are the same. The primary symptoms are pain and pressure around the affected sinuses. Other symptoms include:  Upper toothache.  Earache.  Headache.  Bad breath.  Decreased sense of smell and taste.  A cough, which worsens when you are lying flat.  Fatigue.  Fever.  Thick drainage from your nose, which often is green and may contain pus (purulent).  Swelling and warmth over the affected sinuses. DIAGNOSIS  Your caregiver will perform a physical exam. During the exam, your caregiver may:  Look in your nose for signs of abnormal growths in your nostrils (nasal polyps).  Tap over the affected sinus to check for signs of infection.  View the inside of your sinuses (endoscopy) with a special imaging device with a light attached (endoscope), which is inserted into your  sinuses. If your caregiver suspects that you have chronic sinusitis, one or more of the following tests may be recommended:  Allergy tests.  Nasal culture A sample of mucus is taken from your nose and sent to a lab and screened for bacteria.  Nasal cytology A sample of mucus is taken from your nose and examined by your caregiver to determine if your sinusitis is related to an allergy. TREATMENT  Most cases of acute sinusitis are related to a viral infection and will resolve on their own within 10 days. Sometimes medicines are prescribed to help relieve symptoms (pain medicine, decongestants, nasal steroid sprays, or saline sprays).  However, for sinusitis related to a bacterial infection, your caregiver will prescribe antibiotic medicines. These are medicines that will help kill the bacteria causing the infection.  Rarely, sinusitis is caused by a fungal infection. In theses cases, your caregiver will prescribe antifungal medicine. For some cases of chronic sinusitis, surgery is needed. Generally, these are cases in which sinusitis recurs more than 3 times per year, despite other treatments. HOME CARE INSTRUCTIONS   Drink plenty of water. Water helps thin the mucus so your sinuses can drain more easily.  Use a humidifier.  Inhale steam 3 to 4 times a day (for example, sit in the bathroom with the shower running).  Apply a warm, moist washcloth to your face 3 to 4 times a day, or as directed by your caregiver.  Use saline nasal sprays to help moisten and clean your sinuses.  Take over-the-counter or prescription medicines for pain, discomfort, or fever only as directed by your caregiver. SEEK IMMEDIATE MEDICAL   CARE IF:  You have increasing pain or severe headaches.  You have nausea, vomiting, or drowsiness.  You have swelling around your face.  You have vision problems.  You have a stiff neck.  You have difficulty breathing. MAKE SURE YOU:   Understand these  instructions.  Will watch your condition.  Will get help right away if you are not doing well or get worse. Document Released: 04/10/2005 Document Revised: 07/03/2011 Document Reviewed: 04/25/2011 ExitCare Patient Information 2014 [employer].   Bronchitis Bronchitis is the body's way of reacting to injury and/or infection (inflammation) of the bronchi. Bronchi are the air tubes that extend from the windpipe into the lungs. If the inflammation becomes severe, it may cause shortness of breath. CAUSES  Inflammation may be caused by:  A virus.  Germs (bacteria).  Dust.  Allergens.  Pollutants and many other irritants. The cells lining the bronchial tree are covered with tiny hairs (cilia). These constantly beat upward, away from the lungs, toward the mouth. This keeps the lungs free of pollutants. When these cells become too irritated and are unable to do their job, mucus begins to develop. This causes the characteristic cough of bronchitis. The cough clears the lungs when the cilia are unable to do their job. Without either of these protective mechanisms, the mucus would settle in the lungs. Then you would develop pneumonia. Smoking is a common cause of bronchitis and can contribute to pneumonia. Stopping this habit is the single most important thing you can do to help yourself. TREATMENT   Your caregiver may prescribe an antibiotic if the cough is caused by bacteria. Also, medicines that open up your airways make it easier to breathe. Your caregiver may also recommend or prescribe an expectorant. It will loosen the mucus to be coughed up. Only take over-the-counter or prescription medicines for pain, discomfort, or fever as directed by your caregiver.  Removing whatever causes the problem (smoking, for example) is critical to preventing the problem from getting worse.  Cough suppressants may be prescribed for relief of cough symptoms.  Inhaled medicines may be prescribed to help  with symptoms now and to help prevent problems from returning.  For those with recurrent (chronic) bronchitis, there may be a need for steroid medicines. SEEK IMMEDIATE MEDICAL CARE IF:   During treatment, you develop more pus-like mucus (purulent sputum).  You have a fever.  You become progressively more ill.  You have increased difficulty breathing, wheezing, or shortness of breath. It is necessary to seek immediate medical care if you are elderly or sick from any other disease. MAKE SURE YOU:   Understand these instructions.  Will watch your condition.  Will get help right away if you are not doing well or get worse. Document Released: 04/10/2005 Document Revised: 12/11/2012 Document Reviewed: 12/03/2012 ExitCare Patient Information 2014 [employer].  

## 2013-05-28 ENCOUNTER — Encounter (HOSPITAL_COMMUNITY): Payer: Self-pay | Admitting: Pharmacist

## 2013-06-05 ENCOUNTER — Encounter (HOSPITAL_COMMUNITY): Payer: Self-pay

## 2013-06-06 ENCOUNTER — Other Ambulatory Visit: Payer: Self-pay | Admitting: Obstetrics and Gynecology

## 2013-06-06 NOTE — Patient Instructions (Signed)
Your procedure is scheduled on: Wednesday, Feb. 18, 2015  [facility] at: 11:00am  Pick up the phone at the desk and dial [phone].  Call this number if you have problems the morning of surgery: [phone].  Remember: Do NOT eat food: AFTER MIDNIGHT TUESDAY Do NOT drink clear liquids after: AFTER 8:30AM WEDNESDAY Take these medicines the morning of surgery with a SIP OF WATER: NONE  Do NOT wear jewelry (body piercing), make-up, or nail polish. Do NOT wear lotions, powders, or perfumes.  You may wear deoderant. Do NOT shave for 48 hours prior to surgery. Do NOT bring valuables to the hospital. Contacts, dentures, or bridgework may not be worn into surgery. Leave suitcase in car.  After surgery it may be brought to your room.  For patients admitted to the hospital, checkout time is 11:00 AM the day of discharge.

## 2013-06-09 ENCOUNTER — Encounter (HOSPITAL_COMMUNITY)
Admission: RE | Admit: 2013-06-09 | Discharge: 2013-06-09 | Disposition: A | Discharge status: Home / Self Care | Payer: BC Managed Care – PPO | Source: Ambulatory Visit | Attending: Radiology | Admitting: Radiology

## 2013-06-09 ENCOUNTER — Encounter (HOSPITAL_COMMUNITY): Payer: Self-pay

## 2013-06-09 HISTORY — DX: Pneumonia, unspecified organism: J18.9

## 2013-06-09 HISTORY — DX: Gastro-esophageal reflux disease without esophagitis: K21.9

## 2013-06-09 HISTORY — DX: Anemia, unspecified: D64.9

## 2013-06-09 HISTORY — DX: Chronic pulmonary edema: J81.1

## 2013-06-09 LAB — TYPE AND SCREEN
ABO/RH(D): O POS
Antibody Screen: NEGATIVE

## 2013-06-09 LAB — CBC
HCT: 30 % — ABNORMAL LOW (ref 36.0–46.0)
Hemoglobin: 10 g/dL — ABNORMAL LOW (ref 12.0–15.0)
MCH: 28.3 pg (ref 26.0–34.0)
MCHC: 33.3 g/dL (ref 30.0–36.0)
MCV: 85 fL (ref 78.0–100.0)
Platelets: 363 10*3/uL (ref 150–400)
RBC: 3.53 MIL/uL — ABNORMAL LOW (ref 3.87–5.11)
RDW: 15.7 % — ABNORMAL HIGH (ref 11.5–15.5)
WBC: 7.2 10*3/uL (ref 4.0–10.5)

## 2013-06-09 LAB — SYPHILIS: RPR W/REFLEX TO RPR TITER AND TREPONEMAL ANTIBODIES, TRADITIONAL SCREENING AND DIAGNOSIS ALGORITHM: RPR Ser Ql: NONREACTIVE

## 2013-06-10 MED ORDER — DEXTROSE 5 % IV SOLN
3.0000 g | INTRAVENOUS | Status: AC
  Administered 2013-06-11: 3 g via INTRAVENOUS
  Filled 2013-06-10: qty 3000

## 2013-06-10 NOTE — H&P (Signed)
33 y.o.  [id number]    [id number] comes in for a repeat cesarean section at term.  Patient has good fetal movement and no bleeding.   Past Medical History  Diagnosis Date  . Pneumonia   . Pulmonary edema     after delivery on last child  . GERD (gastroesophageal reflux disease)   . Anemia     Past Surgical History  Procedure Laterality Date  . Closed reduction finger fracture    . Cesarean section      x2    OB History  Gravida Para Term Preterm AB SAB TAB Ectopic Multiple Living  4 2   1 1    2     # Outcome Date GA Lbr Len/2nd Weight Sex Delivery Anes PTL Lv  4 CUR           3 SAB           2 PAR           1 PAR               History   Social History  . Marital Status: Married    Spouse Name: N/A    Number of Children: N/A  . Years of Education: N/A   Occupational History  . Not on file.   Social History Main Topics  . Smoking status: Former Smoker -- 0.25 packs/day for 17 years    Quit date: 06/09/2012  . Smokeless tobacco: Not on file  . Alcohol Use: No  . Drug Use: No  . Sexual Activity: Not on file   Other Topics Concern  . Not on file   Social History Narrative  . No narrative on file   Review of patient's allergies indicates no known allergies.   Prenatal Course: uncomplicated.   Prenatal Transfer Tool  Maternal Diabetes: No Genetic Screening: Declined Maternal Ultrasounds/Referrals: Normal Fetal Ultrasounds or other Referrals:  None Maternal Substance Abuse:  No Significant Maternal Medications:  None Significant Maternal Lab Results: None   There were no vitals filed for this visit.   Lungs/Cor:  NAD Abdomen:  soft, gravid Ex:  no cords, erythema SVE:  NA FHTs:  Present.  A/P   For repeat cesarean sectionat term.  All risks, benefits and alternatives discussed with patient and she desires to proceed.  Marguriete Wootan A

## 2013-06-11 ENCOUNTER — Encounter (HOSPITAL_COMMUNITY): Payer: BC Managed Care – PPO | Admitting: Anesthesiology

## 2013-06-11 ENCOUNTER — Inpatient Hospital Stay (HOSPITAL_COMMUNITY): Payer: BC Managed Care – PPO | Admitting: Anesthesiology

## 2013-06-11 ENCOUNTER — Inpatient Hospital Stay (HOSPITAL_COMMUNITY)
Admission: RE | Admit: 2013-06-11 | Discharge: 2013-06-14 | DRG: 766 | Disposition: A | Discharge status: Home / Self Care | Payer: BC Managed Care – PPO | Source: Ambulatory Visit | Attending: Obstetrics and Gynecology | Admitting: Obstetrics and Gynecology

## 2013-06-11 ENCOUNTER — Encounter (HOSPITAL_COMMUNITY)
Admission: RE | Disposition: A | Discharge status: Home / Self Care | Payer: Self-pay | Source: Ambulatory Visit | Attending: Obstetrics and Gynecology

## 2013-06-11 ENCOUNTER — Encounter (HOSPITAL_COMMUNITY): Payer: Self-pay

## 2013-06-11 DIAGNOSIS — E669 Obesity, unspecified: Secondary | ICD-10-CM | POA: Diagnosis present

## 2013-06-11 DIAGNOSIS — Z9889 Other specified postprocedural states: Secondary | ICD-10-CM

## 2013-06-11 DIAGNOSIS — Z87891 Personal history of nicotine dependence: Secondary | ICD-10-CM

## 2013-06-11 DIAGNOSIS — D649 Anemia, unspecified: Secondary | ICD-10-CM | POA: Diagnosis present

## 2013-06-11 DIAGNOSIS — O9902 Anemia complicating childbirth: Secondary | ICD-10-CM | POA: Diagnosis present

## 2013-06-11 DIAGNOSIS — Z6841 Body Mass Index (BMI) 40.0 and over, adult: Secondary | ICD-10-CM

## 2013-06-11 DIAGNOSIS — K219 Gastro-esophageal reflux disease without esophagitis: Secondary | ICD-10-CM | POA: Diagnosis present

## 2013-06-11 DIAGNOSIS — O34219 Maternal care for unspecified type scar from previous cesarean delivery: Principal | ICD-10-CM | POA: Diagnosis present

## 2013-06-11 DIAGNOSIS — Z302 Encounter for sterilization: Secondary | ICD-10-CM

## 2013-06-11 DIAGNOSIS — O99214 Obesity complicating childbirth: Secondary | ICD-10-CM

## 2013-06-11 SURGERY — Surgical Case
Anesthesia: Spinal | Site: Abdomen | Laterality: Bilateral

## 2013-06-11 MED ORDER — NALBUPHINE HCL 10 MG/ML IJ SOLN
5.0000 mg | INTRAMUSCULAR | Status: DC | PRN
  Administered 2013-06-11 – 2013-06-12 (×2): 10 mg via SUBCUTANEOUS
  Filled 2013-06-11 (×2): qty 1

## 2013-06-11 MED ORDER — PRENATAL MULTIVITAMIN CH
1.0000 | ORAL_TABLET | Freq: Every day | ORAL | Status: DC
  Administered 2013-06-12 – 2013-06-14 (×3): 1 via ORAL
  Filled 2013-06-11 (×3): qty 1

## 2013-06-11 MED ORDER — FLEET ENEMA 7-19 GM/118ML RE ENEM: 1.0000 | ENEMA | Freq: Every day | RECTAL | Status: DC | PRN

## 2013-06-11 MED ORDER — FERROUS SULFATE 325 (65 FE) MG PO TABS
325.0000 mg | ORAL_TABLET | Freq: Two times a day (BID) | ORAL | Status: DC
  Administered 2013-06-12 – 2013-06-14 (×4): 325 mg via ORAL
  Filled 2013-06-11 (×5): qty 1

## 2013-06-11 MED ORDER — FENTANYL CITRATE 0.05 MG/ML IJ SOLN
INTRAMUSCULAR | Status: DC | PRN
  Administered 2013-06-11: 12.5 ug via INTRATHECAL

## 2013-06-11 MED ORDER — DEXTROSE 5 % IV SOLN
1.0000 ug/kg/h | INTRAVENOUS | Status: DC | PRN
  Filled 2013-06-11: qty 2

## 2013-06-11 MED ORDER — DIBUCAINE 1 % RE OINT: 1.0000 "application " | TOPICAL_OINTMENT | RECTAL | Status: DC | PRN

## 2013-06-11 MED ORDER — MEPERIDINE HCL 25 MG/ML IJ SOLN: 6.2500 mg | INTRAMUSCULAR | Status: DC | PRN

## 2013-06-11 MED ORDER — LACTATED RINGERS IV SOLN
Freq: Once | INTRAVENOUS | Status: AC
  Administered 2013-06-11: 12:00:00 via INTRAVENOUS

## 2013-06-11 MED ORDER — DIPHENHYDRAMINE HCL 50 MG/ML IJ SOLN: 12.5000 mg | INTRAMUSCULAR | Status: DC | PRN

## 2013-06-11 MED ORDER — LACTATED RINGERS IV SOLN
INTRAVENOUS | Status: DC
  Administered 2013-06-11 (×2): via INTRAVENOUS

## 2013-06-11 MED ORDER — ONDANSETRON HCL 4 MG/2ML IJ SOLN
4.0000 mg | Freq: Three times a day (TID) | INTRAMUSCULAR | Status: DC | PRN
Start: 2013-06-11 — End: 2013-06-14

## 2013-06-11 MED ORDER — LACTATED RINGERS IV SOLN
INTRAVENOUS | Status: DC
  Administered 2013-06-11: 22:00:00 via INTRAVENOUS

## 2013-06-11 MED ORDER — NALOXONE HCL 0.4 MG/ML IJ SOLN: 0.4000 mg | INTRAMUSCULAR | Status: DC | PRN

## 2013-06-11 MED ORDER — OXYCODONE-ACETAMINOPHEN 5-325 MG PO TABS
1.0000 | ORAL_TABLET | ORAL | Status: DC | PRN
  Administered 2013-06-11 – 2013-06-12 (×3): 1 via ORAL
  Administered 2013-06-12: 2 via ORAL
  Administered 2013-06-12 – 2013-06-13 (×4): 1 via ORAL
  Administered 2013-06-13: 2 via ORAL
  Filled 2013-06-11 (×2): qty 1
  Filled 2013-06-11: qty 2
  Filled 2013-06-11 (×3): qty 1
  Filled 2013-06-11: qty 2
  Filled 2013-06-11 (×2): qty 1

## 2013-06-11 MED ORDER — ONDANSETRON HCL 4 MG PO TABS
4.0000 mg | ORAL_TABLET | ORAL | Status: DC | PRN
  Administered 2013-06-13: 4 mg via ORAL
  Filled 2013-06-11 (×2): qty 1

## 2013-06-11 MED ORDER — ZOLPIDEM TARTRATE 5 MG PO TABS: 5.0000 mg | ORAL_TABLET | Freq: Every evening | ORAL | Status: DC | PRN

## 2013-06-11 MED ORDER — ONDANSETRON HCL 4 MG/2ML IJ SOLN: 4.0000 mg | INTRAMUSCULAR | Status: DC | PRN

## 2013-06-11 MED ORDER — IBUPROFEN 600 MG PO TABS
600.0000 mg | ORAL_TABLET | Freq: Four times a day (QID) | ORAL | Status: DC
  Administered 2013-06-11 – 2013-06-14 (×10): 600 mg via ORAL
  Filled 2013-06-11 (×11): qty 1

## 2013-06-11 MED ORDER — KETOROLAC TROMETHAMINE 30 MG/ML IJ SOLN
INTRAMUSCULAR | Status: AC
  Administered 2013-06-11: 30 mg via INTRAVENOUS
  Filled 2013-06-11: qty 1

## 2013-06-11 MED ORDER — OXYTOCIN 10 UNIT/ML IJ SOLN
INTRAMUSCULAR | Status: AC
  Filled 2013-06-11: qty 4

## 2013-06-11 MED ORDER — LACTATED RINGERS IV SOLN
INTRAVENOUS | Status: DC
Start: 2013-06-11 — End: 2013-06-11
  Administered 2013-06-11 (×3): via INTRAVENOUS

## 2013-06-11 MED ORDER — BUPIVACAINE IN DEXTROSE 0.75-8.25 % IT SOLN
INTRATHECAL | Status: DC | PRN
  Administered 2013-06-11: 1.8 mL via INTRATHECAL

## 2013-06-11 MED ORDER — PHENYLEPHRINE 8 MG IN D5W 100 ML (0.08MG/ML) PREMIX OPTIME
INJECTION | INTRAVENOUS | Status: DC | PRN
  Administered 2013-06-11: 60 ug/min via INTRAVENOUS

## 2013-06-11 MED ORDER — NALBUPHINE HCL 10 MG/ML IJ SOLN: 5.0000 mg | INTRAMUSCULAR | Status: DC | PRN

## 2013-06-11 MED ORDER — PHENYLEPHRINE 8 MG IN D5W 100 ML (0.08MG/ML) PREMIX OPTIME
INJECTION | INTRAVENOUS | Status: AC
  Filled 2013-06-11: qty 100

## 2013-06-11 MED ORDER — KETOROLAC TROMETHAMINE 30 MG/ML IJ SOLN: 30.0000 mg | Freq: Four times a day (QID) | INTRAMUSCULAR | Status: AC | PRN

## 2013-06-11 MED ORDER — DIPHENHYDRAMINE HCL 50 MG/ML IJ SOLN
INTRAMUSCULAR | Status: DC | PRN
  Administered 2013-06-11: 25 mg via INTRAVENOUS

## 2013-06-11 MED ORDER — ONDANSETRON HCL 4 MG/2ML IJ SOLN
INTRAMUSCULAR | Status: AC
  Filled 2013-06-11: qty 2

## 2013-06-11 MED ORDER — ONDANSETRON HCL 4 MG/2ML IJ SOLN
INTRAMUSCULAR | Status: DC | PRN
  Administered 2013-06-11: 4 mg via INTRAVENOUS

## 2013-06-11 MED ORDER — SIMETHICONE 80 MG PO CHEW
80.0000 mg | CHEWABLE_TABLET | Freq: Three times a day (TID) | ORAL | Status: DC
  Administered 2013-06-12 – 2013-06-14 (×6): 80 mg via ORAL
  Filled 2013-06-11 (×7): qty 1

## 2013-06-11 MED ORDER — OXYTOCIN 40 UNITS IN LACTATED RINGERS INFUSION - SIMPLE MED: 62.5000 mL/h | INTRAVENOUS | Status: AC

## 2013-06-11 MED ORDER — DIPHENHYDRAMINE HCL 50 MG/ML IJ SOLN
25.0000 mg | INTRAMUSCULAR | Status: DC | PRN
Start: 2013-06-11 — End: 2013-06-14

## 2013-06-11 MED ORDER — METHYLERGONOVINE MALEATE 0.2 MG PO TABS: 0.2000 mg | ORAL_TABLET | ORAL | Status: DC | PRN

## 2013-06-11 MED ORDER — SCOPOLAMINE 1 MG/3DAYS TD PT72: 1.0000 | MEDICATED_PATCH | Freq: Once | TRANSDERMAL | Status: DC

## 2013-06-11 MED ORDER — SCOPOLAMINE 1 MG/3DAYS TD PT72
1.0000 | MEDICATED_PATCH | Freq: Once | TRANSDERMAL | Status: DC
  Administered 2013-06-11: 1.5 mg via TRANSDERMAL

## 2013-06-11 MED ORDER — FENTANYL CITRATE 0.05 MG/ML IJ SOLN
INTRAMUSCULAR | Status: AC
  Filled 2013-06-11: qty 2

## 2013-06-11 MED ORDER — DIPHENHYDRAMINE HCL 25 MG PO CAPS
25.0000 mg | ORAL_CAPSULE | ORAL | Status: DC | PRN
Start: 2013-06-11 — End: 2013-06-14
  Administered 2013-06-12: 25 mg via ORAL
  Filled 2013-06-11 (×2): qty 1

## 2013-06-11 MED ORDER — METHYLERGONOVINE MALEATE 0.2 MG/ML IJ SOLN: 0.2000 mg | INTRAMUSCULAR | Status: DC | PRN

## 2013-06-11 MED ORDER — TETANUS-DIPHTH-ACELL PERTUSSIS 5-2.5-18.5 LF-MCG/0.5 IM SUSP
0.5000 mL | Freq: Once | INTRAMUSCULAR | Status: AC
  Administered 2013-06-12: 0.5 mL via INTRAMUSCULAR

## 2013-06-11 MED ORDER — SODIUM CHLORIDE 0.9 % IJ SOLN: 3.0000 mL | INTRAMUSCULAR | Status: DC | PRN

## 2013-06-11 MED ORDER — METOCLOPRAMIDE HCL 5 MG/ML IJ SOLN: 10.0000 mg | Freq: Three times a day (TID) | INTRAMUSCULAR | Status: DC | PRN

## 2013-06-11 MED ORDER — MENTHOL 3 MG MT LOZG: 1.0000 | LOZENGE | OROMUCOSAL | Status: DC | PRN

## 2013-06-11 MED ORDER — DIPHENHYDRAMINE HCL 50 MG/ML IJ SOLN
INTRAMUSCULAR | Status: AC
  Filled 2013-06-11: qty 1

## 2013-06-11 MED ORDER — FENTANYL CITRATE 0.05 MG/ML IJ SOLN
INTRAMUSCULAR | Status: AC
  Administered 2013-06-11: 50 ug via INTRAVENOUS
  Filled 2013-06-11: qty 2

## 2013-06-11 MED ORDER — DIPHENHYDRAMINE HCL 25 MG PO CAPS: 25.0000 mg | ORAL_CAPSULE | Freq: Four times a day (QID) | ORAL | Status: DC | PRN

## 2013-06-11 MED ORDER — LANOLIN HYDROUS EX OINT: 1.0000 "application " | TOPICAL_OINTMENT | CUTANEOUS | Status: DC | PRN

## 2013-06-11 MED ORDER — MORPHINE SULFATE 0.5 MG/ML IJ SOLN
INTRAMUSCULAR | Status: AC
  Filled 2013-06-11: qty 10

## 2013-06-11 MED ORDER — SCOPOLAMINE 1 MG/3DAYS TD PT72
MEDICATED_PATCH | TRANSDERMAL | Status: AC
  Administered 2013-06-11: 1.5 mg via TRANSDERMAL
  Filled 2013-06-11: qty 1

## 2013-06-11 MED ORDER — SIMETHICONE 80 MG PO CHEW
80.0000 mg | CHEWABLE_TABLET | ORAL | Status: DC
  Administered 2013-06-11 – 2013-06-14 (×3): 80 mg via ORAL
  Filled 2013-06-11 (×3): qty 1

## 2013-06-11 MED ORDER — FENTANYL CITRATE 0.05 MG/ML IJ SOLN
25.0000 ug | INTRAMUSCULAR | Status: DC | PRN
  Administered 2013-06-11 (×2): 50 ug via INTRAVENOUS

## 2013-06-11 MED ORDER — BISACODYL 10 MG RE SUPP: 10.0000 mg | Freq: Every day | RECTAL | Status: DC | PRN

## 2013-06-11 MED ORDER — SENNOSIDES-DOCUSATE SODIUM 8.6-50 MG PO TABS
2.0000 | ORAL_TABLET | ORAL | Status: DC
  Administered 2013-06-11 – 2013-06-13 (×2): 2 via ORAL
  Filled 2013-06-11 (×3): qty 2

## 2013-06-11 MED ORDER — MORPHINE SULFATE (PF) 0.5 MG/ML IJ SOLN
INTRAMUSCULAR | Status: DC | PRN
Start: 2013-06-11 — End: 2013-06-11
  Administered 2013-06-11: .2 mg via INTRATHECAL

## 2013-06-11 MED ORDER — WITCH HAZEL-GLYCERIN EX PADS: 1.0000 "application " | MEDICATED_PAD | CUTANEOUS | Status: DC | PRN

## 2013-06-11 MED ORDER — MEASLES, MUMPS & RUBELLA VAC ~~LOC~~ INJ: 0.5000 mL | INJECTION | Freq: Once | SUBCUTANEOUS | Status: DC

## 2013-06-11 MED ORDER — SIMETHICONE 80 MG PO CHEW
80.0000 mg | CHEWABLE_TABLET | ORAL | Status: DC | PRN
  Administered 2013-06-13: 80 mg via ORAL

## 2013-06-11 MED ORDER — KETOROLAC TROMETHAMINE 30 MG/ML IJ SOLN
30.0000 mg | Freq: Four times a day (QID) | INTRAMUSCULAR | Status: AC | PRN
  Administered 2013-06-11: 30 mg via INTRAVENOUS

## 2013-06-11 SURGICAL SUPPLY — 40 items
APL SKNCLS STERI-STRIP NONHPOA (GAUZE/BANDAGES/DRESSINGS) ×1
BENZOIN TINCTURE PRP APPL 2/3 (GAUZE/BANDAGES/DRESSINGS) ×3 IMPLANT
CLAMP CORD UMBIL (MISCELLANEOUS) IMPLANT
CLIP FILSHIE TUBAL LIGA STRL (Clip) ×3 IMPLANT
CLOSURE WOUND 1/2 X4 (GAUZE/BANDAGES/DRESSINGS) ×1
CLOTH BEACON ORANGE TIMEOUT ST (SAFETY) ×3 IMPLANT
DRAPE LG THREE QUARTER DISP (DRAPES) IMPLANT
DRSG OPSITE POSTOP 4X10 (GAUZE/BANDAGES/DRESSINGS) ×3 IMPLANT
DURAPREP 26ML APPLICATOR (WOUND CARE) ×3 IMPLANT
ELECT REM PT RETURN 9FT ADLT (ELECTROSURGICAL) ×3
ELECTRODE REM PT RTRN 9FT ADLT (ELECTROSURGICAL) ×1 IMPLANT
EXTRACTOR VACUUM BELL STYLE (SUCTIONS) IMPLANT
GLOVE BIO SURGEON STRL SZ7 (GLOVE) ×3 IMPLANT
GOWN STRL NON-REIN LRG LVL3 (GOWN DISPOSABLE) ×3 IMPLANT
GOWN STRL REUS W/ TWL XL LVL3 (GOWN DISPOSABLE) ×1 IMPLANT
GOWN STRL REUS W/TWL LRG LVL3 (GOWN DISPOSABLE) ×3 IMPLANT
GOWN STRL REUS W/TWL XL LVL3 (GOWN DISPOSABLE) ×3
KIT ABG SYR 3ML LUER SLIP (SYRINGE) IMPLANT
NDL HYPO 25X5/8 SAFETYGLIDE (NEEDLE) IMPLANT
NEEDLE HYPO 25X5/8 SAFETYGLIDE (NEEDLE) IMPLANT
NS IRRIG 1000ML POUR BTL (IV SOLUTION) ×3 IMPLANT
PACK C SECTION WH (CUSTOM PROCEDURE TRAY) ×3 IMPLANT
PAD OB MATERNITY 4.3X12.25 (PERSONAL CARE ITEMS) ×3 IMPLANT
RETAINER VISCERAL (MISCELLANEOUS) ×2 IMPLANT
RETRACTOR WND ALEXIS 25 LRG (MISCELLANEOUS) ×1 IMPLANT
RTRCTR WOUND ALEXIS 25CM LRG (MISCELLANEOUS) ×3
STAPLER VISISTAT 35W (STAPLE) IMPLANT
STRIP CLOSURE SKIN 1/2X4 (GAUZE/BANDAGES/DRESSINGS) ×2 IMPLANT
SUT MNCRL 0 VIOLET CTX 36 (SUTURE) ×2 IMPLANT
SUT MONOCRYL 0 CTX 36 (SUTURE) ×4
SUT PDS AB 0 CTX 60 (SUTURE) IMPLANT
SUT PLAIN 2 0 XLH (SUTURE) ×2 IMPLANT
SUT VIC AB 0 CT1 27 (SUTURE) ×6
SUT VIC AB 0 CT1 27XBRD ANBCTR (SUTURE) ×2 IMPLANT
SUT VIC AB 2-0 CT1 27 (SUTURE) ×3
SUT VIC AB 2-0 CT1 TAPERPNT 27 (SUTURE) ×1 IMPLANT
SUT VIC AB 4-0 KS 27 (SUTURE) ×3 IMPLANT
TOWEL OR 17X24 6PK STRL BLUE (TOWEL DISPOSABLE) ×3 IMPLANT
TRAY FOLEY CATH 14FR (SET/KITS/TRAYS/PACK) ×3 IMPLANT
WATER STERILE IRR 1000ML POUR (IV SOLUTION) ×1 IMPLANT

## 2013-06-11 NOTE — Anesthesia Procedure Notes (Addendum)
Spinal  Patient location during procedure: OR Start time: 06/11/2013 12:46 PM End time: 06/11/2013 12:50 PM Staffing Anesthesiologist: MOSER, CHRIS Performed by: anesthesiologist  Preanesthetic Checklist Completed: patient identified, surgical consent, pre-op evaluation, timeout performed, IV checked, risks and benefits discussed and monitors and equipment checked Spinal Block Patient position: sitting Prep: site prepped and draped and DuraPrep Patient monitoring: cardiac monitor, heart rate, continuous pulse ox and blood pressure Approach: midline Location: L5-S1 Injection technique: single-shot Needle Needle type: Tuohy and Pencan  Needle gauge: 24 G Needle length: 10 cm Assessment Sensory level: T4

## 2013-06-11 NOTE — Progress Notes (Signed)
There has been no change in the patients history, status or exam since the history and physical.  Filed Vitals:   06/11/13 1050 06/11/13 1145  BP:  139/109  Pulse: 91   Temp: 97.9 F (36.6 C)   TempSrc: Oral   SpO2: 100%     Lab Results  Component Value Date   WBC 7.2 06/09/2013   HGB 10.0* 06/09/2013   HCT 30.0* 06/09/2013   MCV 85.0 06/09/2013   PLT 363 06/09/2013    Cheyenne Lyons A

## 2013-06-11 NOTE — Anesthesia Preprocedure Evaluation (Signed)
Anesthesia Evaluation  Patient identified by MRN, date of birth, ID band Patient awake    Reviewed: Allergy & Precautions, H&P , NPO status , Patient's Chart, lab work & pertinent test results  History of Anesthesia Complications Negative for: history of anesthetic complications  Airway Mallampati: II TM Distance: >3 FB Neck ROM: Full    Dental   Pulmonary former smoker,    Pulmonary exam normal       Cardiovascular negative cardio ROS  Rhythm:Regular Rate:Normal     Neuro/Psych negative neurological ROS  negative psych ROS   GI/Hepatic Neg liver ROS, GERD-  ,  Endo/Other  negative endocrine ROS  Renal/GU negative Renal ROS  negative genitourinary   Musculoskeletal negative musculoskeletal ROS (+)   Abdominal   Peds  Hematology  (+) anemia ,   Anesthesia Other Findings   Reproductive/Obstetrics (+) Pregnancy                           Anesthesia Physical Anesthesia Plan  ASA: III  Anesthesia Plan: Spinal   Post-op Pain Management:    Induction: Intravenous  Airway Management Planned: Natural Airway  Additional Equipment: None  Intra-op Plan:   Post-operative Plan:   Informed Consent: I have reviewed the patients History and Physical, chart, labs and discussed the procedure including the risks, benefits and alternatives for the proposed anesthesia with the patient or authorized representative who has indicated his/her understanding and acceptance.   Dental advisory given  Plan Discussed with: CRNA and Surgeon  Anesthesia Plan Comments:         Anesthesia Quick Evaluation

## 2013-06-11 NOTE — Op Note (Signed)
06/11/2013  1:40 PM  PATIENT:  Cheyenne Lyons  33 y.o. female  PRE-OPERATIVE DIAGNOSIS:  REPEAT / DESIRES STERILIZATION  POST-OPERATIVE DIAGNOSIS:  REPEAT / DESIRES STERILIZATION  PROCEDURE:  Procedure(s): CESAREAN SECTION WITH BILATERAL TUBAL LIGATION (Bilateral)  SURGEON:  Surgeon(s) and Role:    * Loney LaurenceMichelle A Kemani Demarais, MD - Primary    * W Lodema HongScott Bowie, MD - Assisting   ANESTHESIA:   spinal  EBL:  Total I/O In: 1300 [I.V.:1300] Out: 800 [Urine:100; Blood:700]   SPECIMEN:  No Specimen  DISPOSITION OF SPECIMEN:  N/A  COUNTS:  YES  TOURNIQUET:  * No tourniquets in log *  DICTATION: .Note written in EPIC  PLAN OF CARE: Admit to inpatient   PATIENT DISPOSITION:  PACU - hemodynamically stable.   Delay start of Pharmacological VTE agent (>24hrs) due to surgical blood loss or risk of bleeding: not applicable  Complications:  none Medications:  Ancef, Pitocin Findings:  Baby girl, Apgars 8,9, weight P.   Normal tubes, ovaries and uterus seen.  Baby was skin to skin with mother after birth in the OR.  Technique:  After adequate spinal anesthesia was achieved, the patient was prepped and draped in usual sterile fashion.  A foley catheter was used to drain the bladder.  A pfannanstiel incision was made with the scalpel and carried down to the fascia with the bovie cautery. The fascia was incised in the midline with the scalpel and carried in a transverse curvilinear manner bilaterally.  The fascia was reflected superiorly and inferiorly off the rectus muscles and the muscles split in the midline.  A bowel free portion of the peritoneum was entered bluntly and then extended in a superior and inferior manner with good visualization of the bowel and bladder.  The Alexis instrument was then placed and the vesico-uterine fascia tented up and incised in a transverse curvilinear manner.  A 2 cm transverse incision was made in the upper portion of the lower uterine segment until the amnion was  exposed.   The incision was extended transversely in a blunt manner.  Clear fluid was noted and the baby delivered in the vertex presentation without complication.  The baby was bulb suctioned and the cord was clamped and cut.  The baby was then handed to awaiting Neonatology.  The placenta was then delivered manually and the uterus cleared of all debris.  The uterine incision was then closed with a running lock stitch of 0 monocryl.  An imbricating layer of 0 monocryl was closed as well. Excellent hemostasis of the uterine incision was achieved and the abdomen was cleared with irrigation.  The peritoneum was closed with a running stitch of 2-0 vicryl.  This incorporated the rectus muscles as a separate layer.  The fascia was then closed with a running stitch of 0 vicryl.  The subcutaneous layer was closed with interrupted  stitches of 2-0 plain gut.  The skin was closed with 4-0 vicryl on a Keith needle and steri-strips.  The patient tolerated the procedure well and was returned to the recovery room in stable condition.  All counts were correct times three.  Ellie Spickler A

## 2013-06-11 NOTE — Anesthesia Postprocedure Evaluation (Signed)
  Anesthesia Post-op Note  Patient: Cheyenne Lyons  Procedure(s) Performed: Procedure(s): CESAREAN SECTION WITH BILATERAL TUBAL LIGATION (Bilateral)  Patient Location: PACU  Anesthesia Type:Spinal  Level of Consciousness: awake, alert  and oriented  Airway and Oxygen Therapy: Patient Spontanous Breathing  Post-op Pain: mild  Post-op Assessment: Post-op Vital signs reviewed, Patient's Cardiovascular Status Stable, Respiratory Function Stable, Patent Airway, No signs of Nausea or vomiting and Pain level controlled  Post-op Vital Signs: Reviewed and stable  Complications: No apparent anesthesia complications

## 2013-06-11 NOTE — Brief Op Note (Signed)
06/11/2013  1:40 PM  PATIENT:  Cheyenne Lyons  32 y.o. female  PRE-OPERATIVE DIAGNOSIS:  REPEAT / DESIRES STERILIZATION  POST-OPERATIVE DIAGNOSIS:  REPEAT / DESIRES STERILIZATION  PROCEDURE:  Procedure(s): CESAREAN SECTION WITH BILATERAL TUBAL LIGATION (Bilateral)  SURGEON:  Surgeon(s) and Role:    * Mehtab Dolberry A Nakima Fluegge, MD - Primary    * W Scott Bowie, MD - Assisting   ANESTHESIA:   spinal  EBL:  Total I/O In: 1300 [I.V.:1300] Out: 800 [Urine:100; Blood:700]   SPECIMEN:  No Specimen  DISPOSITION OF SPECIMEN:  N/A  COUNTS:  YES  TOURNIQUET:  * No tourniquets in log *  DICTATION: .Note written in EPIC  PLAN OF CARE: Admit to inpatient   PATIENT DISPOSITION:  PACU - hemodynamically stable.   Delay start of Pharmacological VTE agent (>24hrs) due to surgical blood loss or risk of bleeding: not applicable  Complications:  none Medications:  Ancef, Pitocin Findings:  Baby girl, Apgars 8,9, weight P.   Normal tubes, ovaries and uterus seen.  Baby was skin to skin with mother after birth in the OR.  Technique:  After adequate spinal anesthesia was achieved, the patient was prepped and draped in usual sterile fashion.  A foley catheter was used to drain the bladder.  A pfannanstiel incision was made with the scalpel and carried down to the fascia with the bovie cautery. The fascia was incised in the midline with the scalpel and carried in a transverse curvilinear manner bilaterally.  The fascia was reflected superiorly and inferiorly off the rectus muscles and the muscles split in the midline.  A bowel free portion of the peritoneum was entered bluntly and then extended in a superior and inferior manner with good visualization of the bowel and bladder.  The Alexis instrument was then placed and the vesico-uterine fascia tented up and incised in a transverse curvilinear manner.  A 2 cm transverse incision was made in the upper portion of the lower uterine segment until the amnion was  exposed.   The incision was extended transversely in a blunt manner.  Clear fluid was noted and the baby delivered in the vertex presentation without complication.  The baby was bulb suctioned and the cord was clamped and cut.  The baby was then handed to awaiting Neonatology.  The placenta was then delivered manually and the uterus cleared of all debris.  The uterine incision was then closed with a running lock stitch of 0 monocryl.  An imbricating layer of 0 monocryl was closed as well. Excellent hemostasis of the uterine incision was achieved and the abdomen was cleared with irrigation.  The peritoneum was closed with a running stitch of 2-0 vicryl.  This incorporated the rectus muscles as a separate layer.  The fascia was then closed with a running stitch of 0 vicryl.  The subcutaneous layer was closed with interrupted  stitches of 2-0 plain gut.  The skin was closed with 4-0 vicryl on a Keith needle and steri-strips.  The patient tolerated the procedure well and was returned to the recovery room in stable condition.  All counts were correct times three.  Aneesa Romey A    

## 2013-06-11 NOTE — Transfer of Care (Signed)
Immediate Anesthesia Transfer of Care Note  Patient: Cheyenne Lyons  Procedure(s) Performed: Procedure(s): CESAREAN SECTION WITH BILATERAL TUBAL LIGATION (Bilateral)  Patient Location: PACU  Anesthesia Type:Spinal  Level of Consciousness: awake, alert  and oriented  Airway & Oxygen Therapy: Patient Spontanous Breathing  Post-op Assessment: Report given to PACU RN and Post -op Vital signs reviewed and stable  Post vital signs: Reviewed and stable  Complications: No apparent anesthesia complications

## 2013-06-12 ENCOUNTER — Encounter (HOSPITAL_COMMUNITY): Payer: Self-pay | Admitting: Obstetrics and Gynecology

## 2013-06-12 LAB — CBC
HCT: 27.7 % — ABNORMAL LOW (ref 36.0–46.0)
Hemoglobin: 9.1 g/dL — ABNORMAL LOW (ref 12.0–15.0)
MCH: 28.1 pg (ref 26.0–34.0)
MCHC: 32.9 g/dL (ref 30.0–36.0)
MCV: 85.5 fL (ref 78.0–100.0)
PLATELETS: 314 10*3/uL (ref 150–400)
RBC: 3.24 MIL/uL — AB (ref 3.87–5.11)
RDW: 16.1 % — ABNORMAL HIGH (ref 11.5–15.5)
WBC: 6.8 10*3/uL (ref 4.0–10.5)

## 2013-06-12 LAB — BIRTH TISSUE RECOVERY COLLECTION (PLACENTA DONATION)

## 2013-06-12 NOTE — Lactation Note (Signed)
This note was copied from the chart of [name]. Lactation Consultation Note      Initial consult with this mom and baby, now 23 hours post partum, and baby 39 2/[redacted] weeks gestation. The baby has been cluster feeding, with 4 wet and 1 dirty diaper already. Mom is obese, with very large, soft breasts. She was sitting on the side of the bed, latching in cradle hold. I had her sit back in bed, and showed mom how to use cross cradle to both obtain a deeper latch, and to protect her baby's airway. With cradle hold, as soon as mom's arm comes across twell the baby and she were doing. Basic teaching from the Baby and Me book done, and lactation services and community services reviewed with mom. Mom seemed interested in the BF support group. Mom knows to call for lactation with questions/concerns.  Patient Name: [name] ZOXWR'UToday's Date: 06/12/2013 Reason for consult: Initial assessment   Maternal Data Formula Feeding for Exclusion: No Infant to breast within first hour of birth: Yes Has patient been taught Hand Expression?: Yes Does the patient have breastfeeding experience prior to this delivery?: Yes  Feeding Feeding Type: Breast Fed Length of feed: 20 min  LATCH Score/Interventions Latch: Grasps breast easily, tongue down, lips flanged, rhythmical sucking. (bottom lip manually flanged out more with my finger)  Audible Swallowing: A few with stimulation Intervention(s): Skin to skin;Hand expression  Type of Nipple: Everted at rest and after stimulation  Comfort (Breast/Nipple): Soft / non-tender (very large, soft breast)     Hold (Positioning): Assistance needed to correctly position infant at breast and maintain latch. (I showed mom how to use cross crade to obtain a deeper latch, and to protect hte baby's airway) Intervention(s): Breastfeeding basics reviewed;Support Pillows;Position options;Skin to skin  LATCH Score: 8  Lactation Tools Discussed/Used     Consult  Status Consult Status: Follow-up Date: 06/13/13 Follow-up type: In-patient    [name], [name] 06/12/2013, 1:43 PM

## 2013-06-12 NOTE — Anesthesia Postprocedure Evaluation (Signed)
Anesthesia Post Note  Patient: Cheyenne Lyons  Procedure(s) Performed: Procedure(s) (LRB): CESAREAN SECTION WITH BILATERAL TUBAL LIGATION (Bilateral)  Anesthesia type: SAB  Patient location: Mother/Baby  Post pain: Pain level controlled  Post assessment: Post-op Vital signs reviewed  Last Vitals:  Filed Vitals:   06/12/13 0534  BP: 123/77  Pulse: 88  Temp: 36.7 C  Resp: 18    Post vital signs: Reviewed  Level of consciousness: awake  Complications: No apparent anesthesia complications

## 2013-06-12 NOTE — Progress Notes (Signed)
POD #1 R-c/s with BTL Patient is eating, ambulating, voiding.  Pain control is good. +flatus. Appropriate lochia.  No CP/SOB.  Filed Vitals:   06/11/13 2210 06/11/13 2330 06/12/13 0058 06/12/13 0534  BP: 129/82 112/70 96/60 123/77  Pulse: 102 92 81 88  Temp:  97.7 F (36.5 C) 98.3 F (36.8 C) 98.1 F (36.7 C)  TempSrc:  Oral Oral Oral  Resp:  18 18 18   Weight:      SpO2:  97% 96% 98%    Fundus firm - but limited exam due to habitus Perineum without swelling. Inc: c/d/i Ext: no CT  Lab Results  Component Value Date   WBC 6.8 06/12/2013   HGB 9.1* 06/12/2013   HCT 27.7* 06/12/2013   MCV 85.5 06/12/2013   PLT 314 06/12/2013    --/--/O POS (02/16 1110)  A/P Post op day #1 s/p R c/s with BTL Morbid obesity - wound risk - appears to be healing well. H/o pulmonary edema postpartum last pregnancy  Routine care.    Philip AspenALLAHAN, Elizandro Laura

## 2013-06-12 NOTE — Addendum Note (Signed)
Addendum created 06/12/13 0726 by Jhonnie GarnerBeth M Shariah Assad, CRNA   Modules edited: Notes Section   Notes Section:  File: [id number]

## 2013-06-14 MED ORDER — OXYCODONE-ACETAMINOPHEN 5-325 MG PO TABS: 2.0000 | ORAL_TABLET | ORAL | Status: DC | PRN

## 2013-06-14 MED ORDER — IBUPROFEN 600 MG PO TABS: 600.0000 mg | ORAL_TABLET | Freq: Four times a day (QID) | ORAL | Status: DC | PRN

## 2013-06-14 MED ORDER — DOCUSATE SODIUM 100 MG PO CAPS
100.0000 mg | ORAL_CAPSULE | Freq: Two times a day (BID) | ORAL | Status: DC
Start: 2013-06-14 — End: 2014-05-08

## 2013-06-14 NOTE — Discharge Summary (Signed)
Obstetric Discharge Summary Reason for Admission: cesarean section Prenatal Procedures: NST and ultrasound Intrapartum Procedures: cesarean: low cervical, transverse, bilateral tubal ligation Postpartum Procedures: none Complications-Operative and Postpartum: none Hemoglobin  Date Value Ref Range Status  06/12/2013 9.1* 12.0 - 15.0 g/dL Final     HCT  Date Value Ref Range Status  06/12/2013 27.7* 36.0 - 46.0 % Final    Physical Exam:  General: alert, cooperative and appears stated age 31Lochia: appropriate Uterine Fundus: firm Incision: healing well DVT Evaluation: No evidence of DVT seen on physical exam.  Discharge Diagnoses: Term Pregnancy-delivered  Discharge Information: Date: 06/14/2013 Activity: pelvic rest Diet: routine Medications: Ibuprofen, Colace and Percocet Condition: improved Instructions: refer to practice specific booklet Discharge to: home Follow-up Information   Follow up with HORVATH,MICHELLE A, MD In 2 weeks. (For wound re-check)    Specialty:  Obstetrics and Gynecology   Contact information:   [address] [place] [id number] [phone]       Newborn Data: Live born female  Birth Weight: 8 lb 0.8 oz (3650 g) APGAR: 8, 9  Home with mother.  Jevaughn Degollado H. 06/14/2013, 10:36 AM

## 2013-06-17 ENCOUNTER — Inpatient Hospital Stay (HOSPITAL_COMMUNITY): Admission: AD | Admit: 2013-06-17 | Payer: Self-pay | Source: Ambulatory Visit | Admitting: Obstetrics and Gynecology

## 2013-08-17 ENCOUNTER — Encounter (HOSPITAL_COMMUNITY): Payer: Self-pay | Admitting: Emergency Medicine

## 2013-08-17 ENCOUNTER — Emergency Department (INDEPENDENT_AMBULATORY_CARE_PROVIDER_SITE_OTHER): Payer: BC Managed Care – PPO

## 2013-08-17 ENCOUNTER — Emergency Department (INDEPENDENT_AMBULATORY_CARE_PROVIDER_SITE_OTHER)
Admission: EM | Admit: 2013-08-17 | Discharge: 2013-08-17 | Disposition: A | Discharge status: Home / Self Care | Payer: BC Managed Care – PPO | Source: Home / Self Care | Attending: Family Medicine | Admitting: Family Medicine

## 2013-08-17 DIAGNOSIS — M25569 Pain in unspecified knee: Secondary | ICD-10-CM

## 2013-08-17 DIAGNOSIS — M25561 Pain in right knee: Secondary | ICD-10-CM

## 2013-08-17 MED ORDER — IBUPROFEN 800 MG PO TABS: 800.0000 mg | ORAL_TABLET | Freq: Three times a day (TID) | ORAL | Status: DC

## 2013-08-17 NOTE — ED Provider Notes (Signed)
CSN: [id number]     Arrival date & time 08/17/13  1301 History   First MD Initiated Contact with Patient 08/17/13 1510     No chief complaint on file.  (Consider location/radiation/quality/duration/timing/severity/associated sxs/prior Treatment) HPI Comments: Pt was bending to get purse from floor when knee pain began. Did not hear a "pop". Denies previous injury or fall.   Patient is a 33 y.o. female presenting with knee pain. The history is provided by the patient.  Knee Pain Location:  Knee Time since incident:  1 day Injury: no   Knee location:  R knee Pain details:    Quality:  Aching   Radiates to:  Does not radiate   Severity:  Moderate   Onset quality:  Sudden   Duration:  1 day   Timing:  Constant   Progression:  Unchanged Chronicity:  New Dislocation: no   Prior injury to area:  No Relieved by:  Nothing Worsened by:  Bearing weight and activity Ineffective treatments:  NSAIDs Associated symptoms: no decreased ROM, no fever, no muscle weakness, no numbness and no swelling   Risk factors: obesity     Past Medical History  Diagnosis Date  . Pneumonia   . Pulmonary edema     after delivery on last child  . GERD (gastroesophageal reflux disease)   . Anemia    Past Surgical History  Procedure Laterality Date  . Closed reduction finger fracture    . Cesarean section      x2  . Cesarean section with bilateral tubal ligation Bilateral 06/11/2013    Procedure: CESAREAN SECTION WITH BILATERAL TUBAL LIGATION;  Surgeon: Loney LaurenceMichelle A Horvath, MD;  Location: [facility];  Service: Obstetrics;  Laterality: Bilateral;   Family History  Problem Relation Age of Onset  . Cancer Maternal Aunt     ovarian  . Cancer Maternal Uncle     throat   History  Substance Use Topics  . Smoking status: Former Smoker -- 0.25 packs/day for 17 years    Quit date: 06/09/2012  . Smokeless tobacco: Not on file  . Alcohol Use: No   OB History   Grav Para Term Preterm Abortions TAB SAB Ect  Mult Living   4 3 1  1  1   3      Review of Systems  Constitutional: Negative for fever and chills.  Musculoskeletal: Negative for joint swelling.       Knee pain  Skin: Negative for color change.    Allergies  Review of patient's allergies indicates no known allergies.  Home Medications   Prior to Admission medications   Medication Sig Start Date End Date Taking? Authorizing Provider  docusate sodium (COLACE) 100 MG capsule Take 1 capsule (100 mg total) by mouth 2 (two) times daily. 06/14/13  Yes Kendra H. Tenny Crawoss, MD  ibuprofen (ADVIL,MOTRIN) 600 MG tablet Take 1 tablet (600 mg total) by mouth every 6 (six) hours as needed. 06/14/13  Yes Kendra H. Tenny Crawoss, MD  Multiple Vitamins-Minerals (ADULT GUMMY) CHEW Chew 1 tablet by mouth daily.   Yes Historical Provider, MD  ibuprofen (ADVIL,MOTRIN) 800 MG tablet Take 1 tablet (800 mg total) by mouth 3 (three) times daily. 08/17/13   Cathlyn ParsonsAngela M Allora Bains, NP  oxyCODONE-acetaminophen (ROXICET) 5-325 MG per tablet Take 2 tablets by mouth every 4 (four) hours as needed for severe pain. May take 1-2 tablets every 4-6 hours as needed for pain 06/14/13   Kendra H. Tenny Crawoss, MD   BP 148/90  Pulse 110  Temp(Src) 98 F (36.7 C) (Oral)  Resp 20  SpO2 99%  Breastfeeding? Yes Physical Exam  Constitutional: She appears well-developed and well-nourished.  Morbidly obese  Musculoskeletal:       Right knee: She exhibits normal range of motion, no swelling, no deformity, no erythema, no LCL laxity, normal patellar mobility, no bony tenderness and no MCL laxity. Tenderness found. Lateral joint line tenderness noted.  Skin: Skin is warm and dry. No erythema.    ED Course  Procedures (including critical care time) Labs Review Labs Reviewed - No data to display  Imaging Review Dg Knee Complete 4 Views Right  08/17/2013   CLINICAL DATA:  Pain, 1 day duration  EXAM: RIGHT KNEE - COMPLETE 4+ VIEW  COMPARISON:  None  FINDINGS: No visible joint effusion. No evidence of  fracture. No joint space narrowing. There are small medial compartment marginal osteophytes. No focal lesion.  IMPRESSION: No acute finding. Normal except for small medial compartment marginal osteophytes.   Electronically Signed   By: Paulina FusiMark  Shogry M.D.   On: 08/17/2013 14:49     MDM   1. Knee pain, right   refer pt to ortho, Dr. August Saucerean. Rx ibuprofen 800mg  TID prn pain #21. Rx extra large walker.      Cathlyn ParsonsAngela M Riki Berninger, NP 08/17/13 [phone]

## 2013-08-17 NOTE — ED Notes (Signed)
C/o Right side lower back x 4 wks  and knee pain x 1 dy.   Denies: Fever, Sob; chest pain

## 2013-08-17 NOTE — Discharge Instructions (Signed)
Knee Pain Knee pain can be a result of an injury or other medical conditions. Treatment will depend on the cause of your pain. HOME CARE  Only take medicine as told by your doctor.  Keep a healthy weight. Being overweight can make the knee hurt more.  Stretch before exercising or playing sports.  If there is constant knee pain, change the way you exercise. Ask your doctor for advice.  Make sure shoes fit well. Choose the right shoe for the sport or activity.  Protect your knees. Wear kneepads if needed.  Rest when you are tired. GET HELP RIGHT AWAY IF:   Your knee pain does not stop.  Your knee pain does not get better.  Your knee joint feels hot to the touch.  You have a fever. MAKE SURE YOU:   Understand these instructions.  Will watch this condition.  Will get help right away if you are not doing well or get worse. Document Released: 07/07/2008 Document Revised: 07/03/2011 Document Reviewed: 07/07/2008 ExitCare Patient Information 2014 [employer].  

## 2013-08-20 NOTE — ED Provider Notes (Signed)
Medical screening examination/treatment/procedure(s) were performed by a resident physician or non-physician practitioner and as the supervising physician I was immediately available for consultation/collaboration.  Adahlia Stembridge, MD    Joylyn Duggin S Sydne Krahl, MD 08/20/13 1758 

## 2013-08-26 ENCOUNTER — Ambulatory Visit: Payer: BC Managed Care – PPO | Attending: Specialist | Admitting: Physical Therapy

## 2013-08-26 DIAGNOSIS — R262 Difficulty in walking, not elsewhere classified: Secondary | ICD-10-CM | POA: Insufficient documentation

## 2013-08-26 DIAGNOSIS — M25569 Pain in unspecified knee: Secondary | ICD-10-CM | POA: Insufficient documentation

## 2013-08-26 DIAGNOSIS — M545 Low back pain, unspecified: Secondary | ICD-10-CM | POA: Insufficient documentation

## 2013-08-26 DIAGNOSIS — M25559 Pain in unspecified hip: Secondary | ICD-10-CM | POA: Insufficient documentation

## 2013-08-26 DIAGNOSIS — IMO0001 Reserved for inherently not codable concepts without codable children: Secondary | ICD-10-CM | POA: Insufficient documentation

## 2013-09-02 ENCOUNTER — Ambulatory Visit: Payer: BC Managed Care – PPO | Admitting: Physical Therapy

## 2013-09-09 ENCOUNTER — Encounter: Payer: BC Managed Care – PPO | Admitting: Physical Therapy

## 2013-09-11 ENCOUNTER — Ambulatory Visit: Payer: BC Managed Care – PPO | Admitting: Physical Therapy

## 2013-09-22 ENCOUNTER — Ambulatory Visit: Payer: BC Managed Care – PPO | Attending: Specialist | Admitting: Physical Therapy

## 2013-09-22 DIAGNOSIS — M25559 Pain in unspecified hip: Secondary | ICD-10-CM | POA: Insufficient documentation

## 2013-09-22 DIAGNOSIS — M545 Low back pain, unspecified: Secondary | ICD-10-CM | POA: Insufficient documentation

## 2013-09-22 DIAGNOSIS — R262 Difficulty in walking, not elsewhere classified: Secondary | ICD-10-CM | POA: Insufficient documentation

## 2013-09-22 DIAGNOSIS — M25569 Pain in unspecified knee: Secondary | ICD-10-CM | POA: Insufficient documentation

## 2013-09-22 DIAGNOSIS — Z5189 Encounter for other specified aftercare: Secondary | ICD-10-CM | POA: Insufficient documentation

## 2013-09-24 ENCOUNTER — Ambulatory Visit: Payer: BC Managed Care – PPO | Admitting: Physical Therapy

## 2013-09-29 ENCOUNTER — Encounter: Payer: BC Managed Care – PPO | Admitting: Physical Therapy

## 2013-10-01 ENCOUNTER — Ambulatory Visit: Payer: BC Managed Care – PPO | Admitting: Physical Therapy

## 2013-10-14 ENCOUNTER — Ambulatory Visit: Payer: BC Managed Care – PPO | Admitting: Physical Therapy

## 2013-10-16 ENCOUNTER — Ambulatory Visit: Payer: BC Managed Care – PPO | Admitting: Physical Therapy

## 2013-10-20 ENCOUNTER — Ambulatory Visit: Payer: BC Managed Care – PPO | Admitting: Physical Therapy

## 2013-10-22 ENCOUNTER — Ambulatory Visit: Payer: BC Managed Care – PPO | Attending: Specialist | Admitting: Physical Therapy

## 2013-10-22 DIAGNOSIS — Z5189 Encounter for other specified aftercare: Secondary | ICD-10-CM | POA: Insufficient documentation

## 2013-10-22 DIAGNOSIS — M25559 Pain in unspecified hip: Secondary | ICD-10-CM | POA: Insufficient documentation

## 2013-10-22 DIAGNOSIS — R262 Difficulty in walking, not elsewhere classified: Secondary | ICD-10-CM | POA: Insufficient documentation

## 2013-10-22 DIAGNOSIS — M25569 Pain in unspecified knee: Secondary | ICD-10-CM | POA: Insufficient documentation

## 2013-10-22 DIAGNOSIS — M545 Low back pain, unspecified: Secondary | ICD-10-CM | POA: Insufficient documentation

## 2013-10-28 ENCOUNTER — Ambulatory Visit: Payer: BC Managed Care – PPO | Admitting: Physical Therapy

## 2013-11-03 ENCOUNTER — Ambulatory Visit: Payer: BC Managed Care – PPO | Admitting: Physical Therapy

## 2013-11-05 ENCOUNTER — Ambulatory Visit: Payer: BC Managed Care – PPO | Admitting: Physical Therapy

## 2013-11-10 ENCOUNTER — Ambulatory Visit: Payer: BC Managed Care – PPO | Admitting: Physical Therapy

## 2013-11-12 ENCOUNTER — Encounter: Payer: BC Managed Care – PPO | Admitting: Physical Therapy

## 2013-11-17 ENCOUNTER — Encounter: Payer: BC Managed Care – PPO | Admitting: Physical Therapy

## 2013-11-19 ENCOUNTER — Encounter: Payer: BC Managed Care – PPO | Admitting: Physical Therapy

## 2014-02-23 ENCOUNTER — Encounter (HOSPITAL_COMMUNITY): Payer: Self-pay | Admitting: Emergency Medicine

## 2014-05-08 ENCOUNTER — Other Ambulatory Visit (INDEPENDENT_AMBULATORY_CARE_PROVIDER_SITE_OTHER): Payer: BLUE CROSS/BLUE SHIELD

## 2014-05-08 ENCOUNTER — Ambulatory Visit (INDEPENDENT_AMBULATORY_CARE_PROVIDER_SITE_OTHER): Payer: BLUE CROSS/BLUE SHIELD | Admitting: Internal Medicine

## 2014-05-08 ENCOUNTER — Encounter: Payer: Self-pay | Admitting: Internal Medicine

## 2014-05-08 VITALS — BP 140/88 | HR 98 | Temp 98.1°F | Resp 20 | Ht 65.0 in | Wt 390.4 lb

## 2014-05-08 DIAGNOSIS — Z0000 Encounter for general adult medical examination without abnormal findings: Secondary | ICD-10-CM

## 2014-05-08 DIAGNOSIS — N809 Endometriosis, unspecified: Secondary | ICD-10-CM

## 2014-05-08 LAB — COMPREHENSIVE METABOLIC PANEL
ALBUMIN: 3.7 g/dL (ref 3.5–5.2)
ALT: 9 U/L (ref 0–35)
AST: 13 U/L (ref 0–37)
Alkaline Phosphatase: 106 U/L (ref 39–117)
BILIRUBIN TOTAL: 0.1 mg/dL — AB (ref 0.2–1.2)
BUN: 12 mg/dL (ref 6–23)
CO2: 28 meq/L (ref 19–32)
CREATININE: 0.74 mg/dL (ref 0.40–1.20)
Calcium: 9.3 mg/dL (ref 8.4–10.5)
Chloride: 107 mEq/L (ref 96–112)
GFR: 115.06 mL/min (ref >60.00)
Glucose, Bld: 108 mg/dL — ABNORMAL HIGH (ref 70–99)
Potassium: 3.9 mEq/L (ref 3.5–5.1)
Sodium: 140 mEq/L (ref 135–145)
TOTAL PROTEIN: 7.7 g/dL (ref 6.0–8.3)

## 2014-05-08 LAB — LIPID PANEL
Cholesterol: 160 mg/dL (ref 0–200)
HDL: 60.2 mg/dL (ref >39.00)
LDL Cholesterol: 85 mg/dL (ref 0–99)
NonHDL: 99.8
TRIGLYCERIDES: 76 mg/dL (ref 0.0–149.0)
Total CHOL/HDL Ratio: 3
VLDL: 15.2 mg/dL (ref 0.0–40.0)

## 2014-05-08 LAB — CBC
HEMATOCRIT: 33.3 % — AB (ref 36.0–46.0)
Hemoglobin: 10.7 g/dL — ABNORMAL LOW (ref 12.0–15.0)
MCHC: 32.3 g/dL (ref 30.0–36.0)
MCV: 81.1 fl (ref 78.0–100.0)
Platelets: 354 10*3/uL (ref 150.0–400.0)
RBC: 4.11 Mil/uL (ref 3.87–5.11)
RDW: 16 % — AB (ref 11.5–15.5)
WBC: 8 10*3/uL (ref 4.0–10.5)

## 2014-05-08 LAB — HEMOGLOBIN A1C: Hgb A1c MFr Bld: 6.4 % (ref 4.6–6.5)

## 2014-05-08 NOTE — Patient Instructions (Signed)
We will check your blood work today. We will call you back with the results.   We think that the stomach pains could be coming from endometriosis which means that the tissue responds to the estrogen levels every month during your cycle and causes you pain. We want you to talk with your Ob/Gyn to see if there is another option for hormone control to help with the pain which is not birth control pills.   Keep up the good work with diet.   Endometriosis Endometriosis is a condition in which the tissue that lines the uterus (endometrium) grows outside of its normal location. The tissue may grow in many locations close to the uterus, but it commonly grows on the ovaries, fallopian tubes, vagina, or bowel. Because the uterus expels, or sheds, its lining every menstrual cycle, there is bleeding wherever the endometrial tissue is located. This can cause pain because blood is irritating to tissues not normally exposed to it.  CAUSES  The cause of endometriosis is not known.  SIGNS AND SYMPTOMS  Often, there are no symptoms. When symptoms are present, they can vary with the location of the displaced tissue. Various symptoms can occur at different times. Although symptoms occur mainly during a woman's menstrual period, they can also occur midcycle and usually stop with menopause. Some people may go months with no symptoms at all. Symptoms may include:   Back or abdominal pain.   Heavier bleeding during periods.   Pain during intercourse.   Painful bowel movements.   Infertility. DIAGNOSIS  Your health care provider will do a physical exam and ask about your symptoms. Various tests may be done, such as:   Blood tests and urine tests. These are done to help rule out other problems.   Ultrasound. This test is done to look for abnormal tissue.   An X-ray of the lower bowel (barium enema).  Laparoscopy. In this procedure, a thin, lighted tube with a tiny camera on the end (laparoscope) is  inserted into your abdomen. This helps your health care provider look for abnormal tissue to confirm the diagnosis. The health care provider may also remove a small piece of tissue (biopsy) from any abnormal tissue found. This tissue sample can then be sent to a lab so it can be looked at under a microscope. TREATMENT  Treatment will vary and may include:   Medicines to relieve pain. Nonsteroidal anti-inflammatory drugs (NSAIDs) are a type of pain medicine that can help to relieve the pain caused by endometriosis.  Hormonal therapy. When using hormonal therapy, periods are eliminated. This eliminates the monthly exposure to blood by the displaced endometrial tissue.   Surgery. Surgery may sometimes be done to remove the abnormal endometrial tissue. In severe cases, surgery may be done to remove the fallopian tubes, uterus, and ovaries (hysterectomy). HOME CARE INSTRUCTIONS   Take all medicines as directed by your health care provider. Do not take aspirin because it may increase bleeding when you are not on hormonal therapy.   Avoid activities that produce pain, including sexual activity. SEEK MEDICAL CARE IF:  You have pelvic pain before, after, or during your periods.  You have pelvic pain between periods that gets worse during your period.  You have pelvic pain during or after sex.  You have pelvic pain with bowel movements or urination, especially during your period.  You have problems getting pregnant.  You have a fever. SEEK IMMEDIATE MEDICAL CARE IF:   Your pain is severe and is  not responding to pain medicine.   You have severe nausea and vomiting, or you cannot keep foods down.   You have pain that is limited to the right lower part of your abdomen.   You have swelling or increasing pain in your abdomen.   You see blood in your stool.  MAKE SURE YOU:   Understand these instructions.  Will watch your condition.  Will get help right away if you are not doing  well or get worse. Document Released: 04/07/2000 Document Revised: 08/25/2013 Document Reviewed: 12/06/2012 [facility] Patient Information 2015 [employer], [place]. This information is not intended to replace advice given to you by your health care provider. Make sure you discuss any questions you have with your health care provider.

## 2014-05-08 NOTE — Progress Notes (Signed)
Pre visit review using our clinic review tool, if applicable. No additional management support is needed unless otherwise documented below in the visit note. 

## 2014-05-10 DIAGNOSIS — N809 Endometriosis, unspecified: Secondary | ICD-10-CM | POA: Insufficient documentation

## 2014-05-10 NOTE — Progress Notes (Signed)
   Subjective:    Patient ID: Cheyenne Lyons, female    DOB: 07/21/1980, 34 y.o.   MRN: [id number]  HPI The patient is a 34 YO female who is coming in to establish care. She has PMH of obesity, c-section (with pain in lower abdomen). She thought it was scar tissue and had it CTed before. She states that the pain has been limiting her exercise and this keeps her from losing weight. She denies chest pains, SOB, leg swelling. She denies urinary symptoms.   Review of Systems  Constitutional: Negative for fever, chills, activity change, appetite change, fatigue and unexpected weight change.  HENT: Negative.   Respiratory: Negative for cough, chest tightness, shortness of breath and wheezing.   Cardiovascular: Negative for chest pain, palpitations and leg swelling.  Gastrointestinal: Positive for abdominal pain. Negative for diarrhea, constipation and abdominal distention.  Musculoskeletal: Negative.   Neurological: Negative.   Psychiatric/Behavioral: Negative.       Objective:   Physical Exam  Constitutional: She is oriented to person, place, and time. She appears well-developed and well-nourished.  Obese  HENT:  Head: Normocephalic and atraumatic.  Eyes: EOM are normal.  Neck: Normal range of motion.  Cardiovascular: Normal rate and regular rhythm.   Pulmonary/Chest: Effort normal and breath sounds normal. No respiratory distress. She has no wheezes. She has no rales.  Abdominal: Soft. Bowel sounds are normal. There is tenderness.  Small area of hard tissue left lower quadrant  Musculoskeletal: She exhibits no edema.  Neurological: She is alert and oriented to person, place, and time. Coordination normal.  Skin: Skin is warm and dry.  Psychiatric: She has a normal mood and affect. Her behavior is normal.   Filed Vitals:   05/08/14 1602  BP: 140/88  Pulse: 98  Temp: 98.1 F (36.7 C)  TempSrc: Oral  Resp: 20  Height: 5\' 5"  (1.651 m)  Weight: 390 lb 6.4 oz (177.084 kg)  SpO2: 98%       Assessment & Plan:

## 2014-05-10 NOTE — Assessment & Plan Note (Signed)
Reviewed CT scan and seems that area of scar tissue could be endometriosis. Recommended since she failed birth control for hormonal control she should return to OB/Gyn and try another option.

## 2014-05-10 NOTE — Assessment & Plan Note (Signed)
Check CMP, lipid panel, HgA1c, CBC. Recommended weight loss. [facility] no complications but checking labs.

## 2014-05-12 ENCOUNTER — Telehealth: Payer: Self-pay | Admitting: Internal Medicine

## 2014-05-12 NOTE — Telephone Encounter (Signed)
Pt request referral to [facility] for pain in side, [facility] need the referral to say endometrosis. Pt was wondering if she can have higher dosage for ibuprofen.

## 2014-05-12 NOTE — Telephone Encounter (Signed)
Please advise, thanks.

## 2014-05-12 NOTE — Telephone Encounter (Signed)
I would advise that she trial hormonal therapy with her Ob/GYN first then can try referral to surgery if no success. Surgery is not first line treatment.

## 2014-05-13 NOTE — Telephone Encounter (Signed)
Left message for patient to call me back. 

## 2014-05-14 NOTE — Telephone Encounter (Signed)
Called pt again still no answer LMOM with md response.../lmb 

## 2014-06-29 IMAGING — US US OB DETAIL+14 WK
2 series · 12 of 28 positions shown · non-contrast
Comparison: none

[Series 1: us ob detail +14 wk · 1 of 4 slices shown (1 of 2)]
[im 4/4]
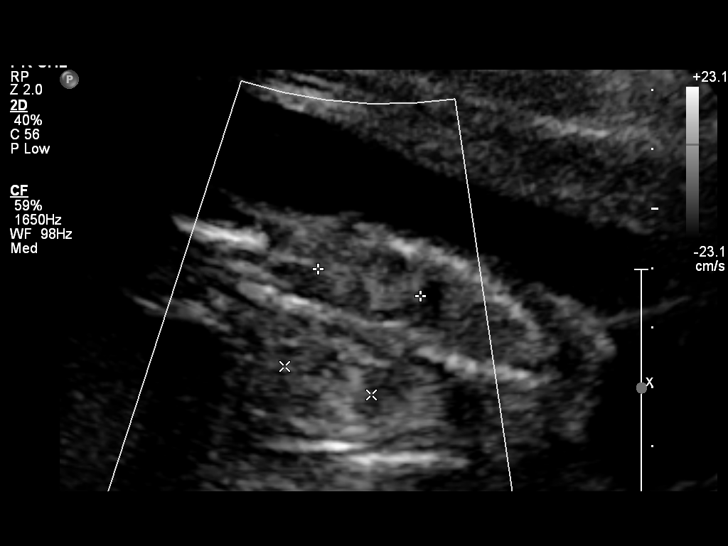

[Series 1: us ob detail +14 wk · 11 of 57 slices shown (2 of 2)]
[im 3/57]
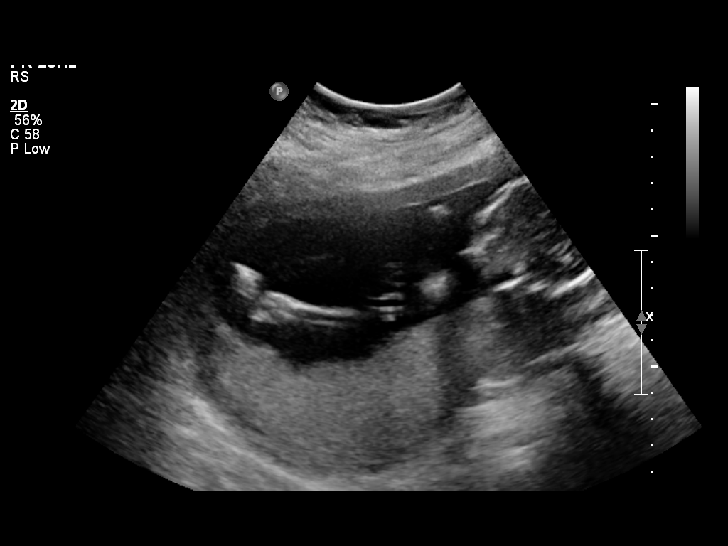
[im 7/57]
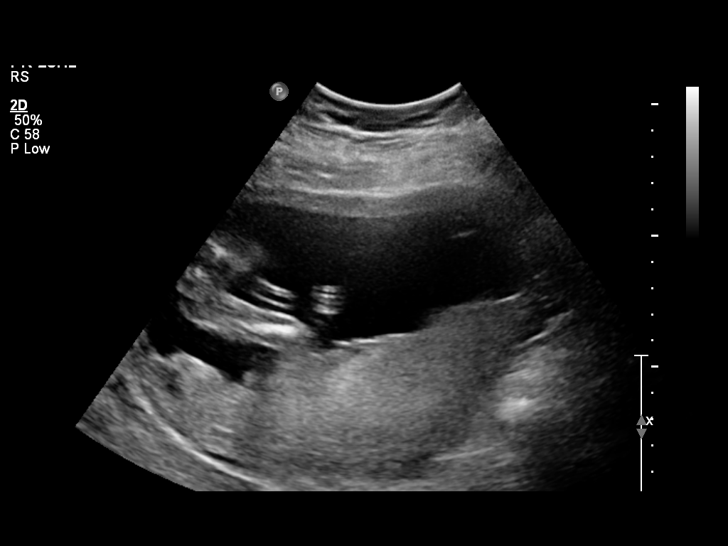
[im 14/57]
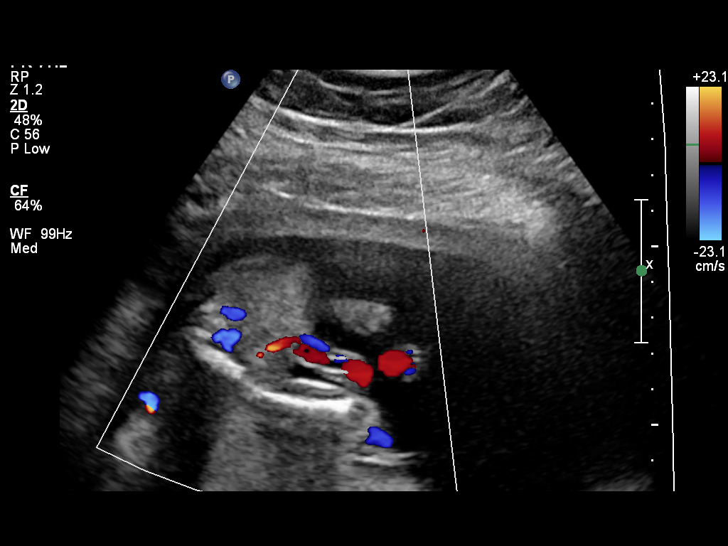
[im 18/57]
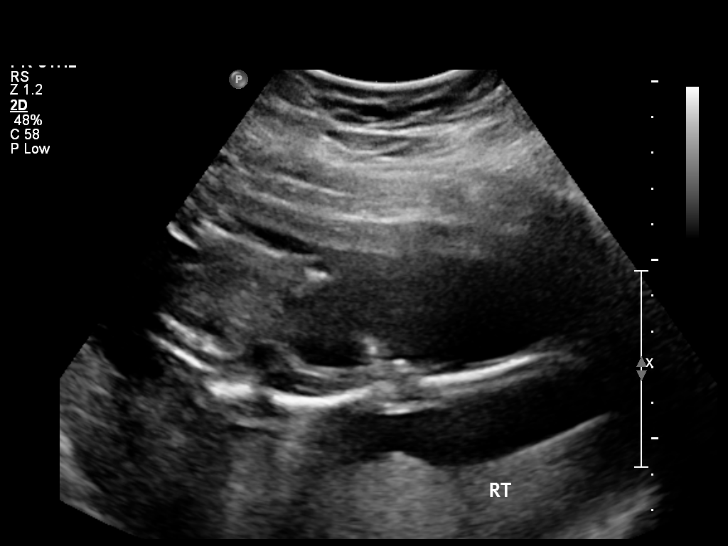
[im 23/57]
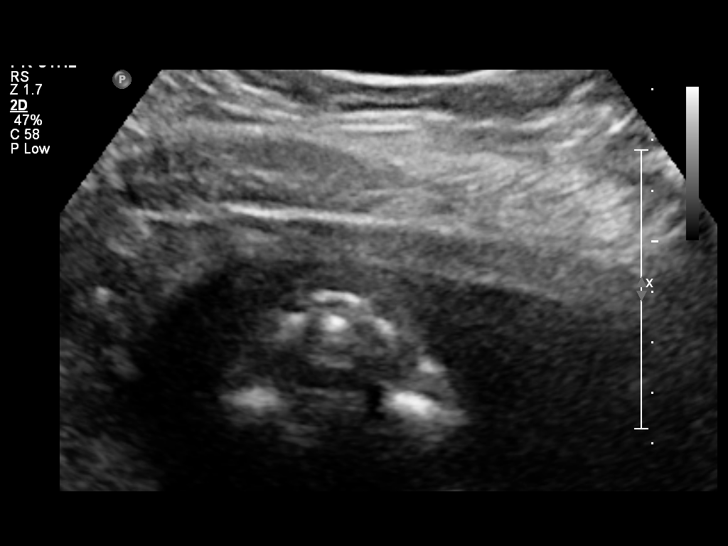
[im 30/57]
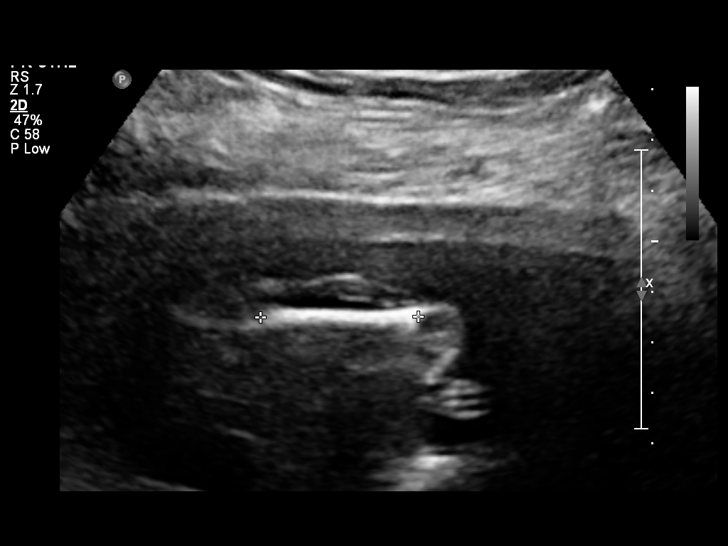
[im 34/57]
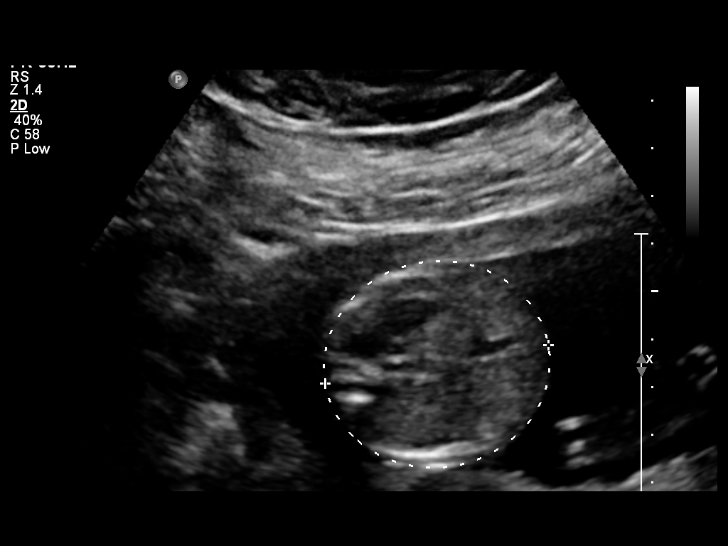
[im 39/57]
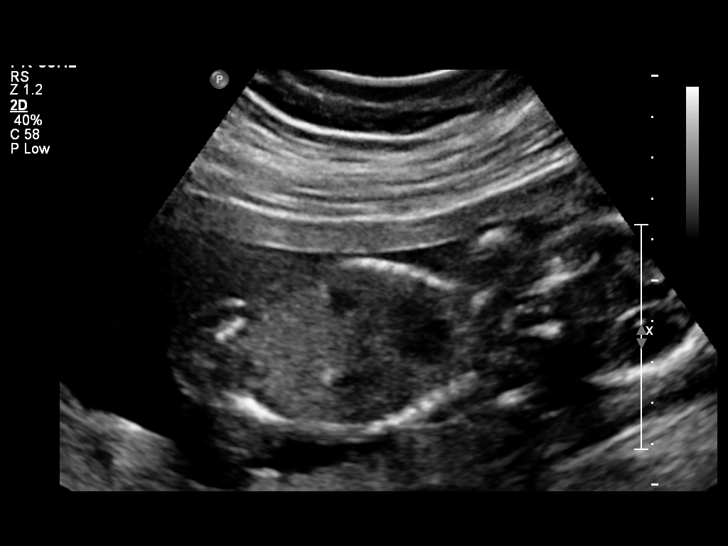
[im 45/57]
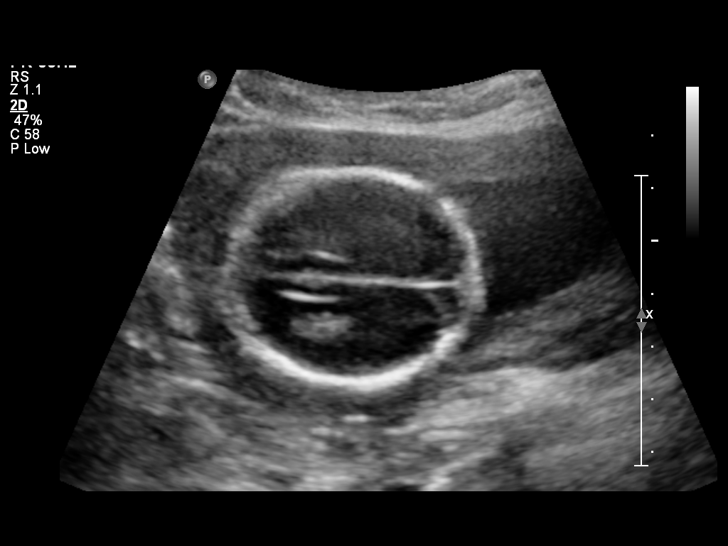
[im 50/57]
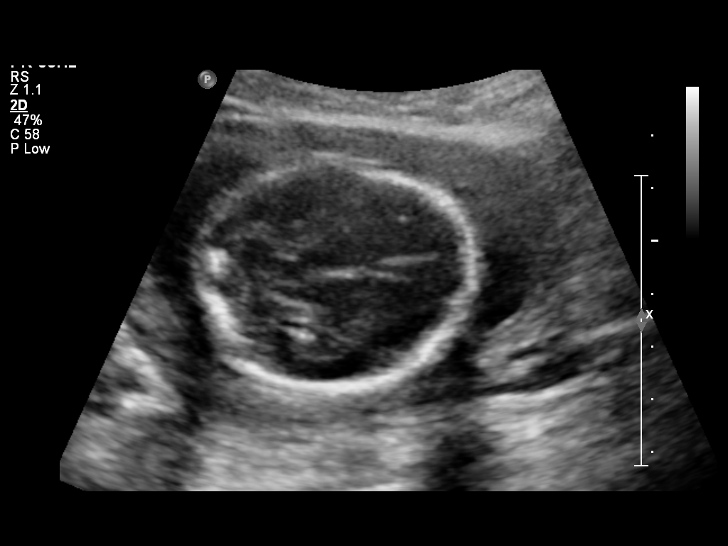
[im 54/57]
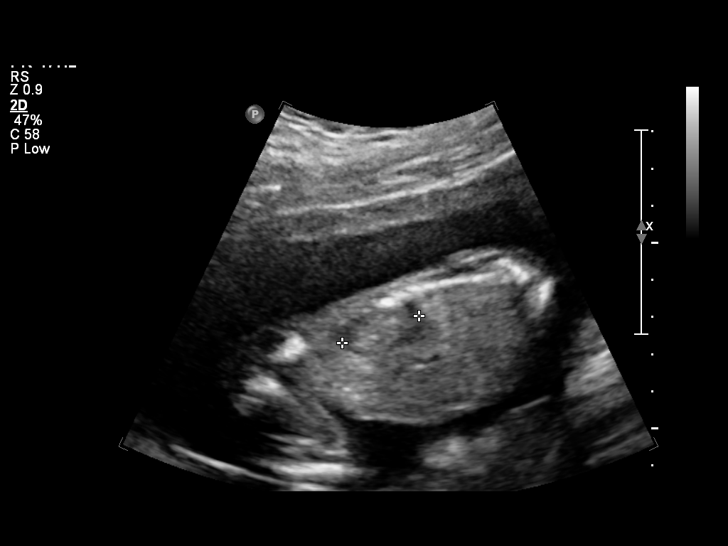

[12 of 28 positions shown; findings below may reference images not displayed]

OBSTETRICS REPORT
                      (Signed Final 01/24/2013 [DATE])

Service(s) Provided

 US OB DETAIL + 14 WK                                  76811.0
Indications

 Detailed fetal anatomic survey
 Maternal morbid obesity (341 lbs)
 Previous cesarean section
Fetal Evaluation

 Num Of Fetuses:    1
 Fetal Heart Rate:  155                          bpm
 Cardiac Activity:  Observed
 Presentation:      Cephalic
 Placenta:          Posterior, above cervical
                    os
 P. Cord            Visualized, central
 Insertion:

 Amniotic Fluid
 AFI FV:      Subjectively within normal limits
                                             Larg Pckt:    6.81  cm
 LLQ:   6.81    cm
Biometry

 BPD:     42.3  mm     G. Age:  18w 6d                CI:        71.96   70 - 86
                                                      FL/HC:      19.7   16.1 -

 HC:     158.7  mm     G. Age:  18w 5d       15  %    HC/AC:      1.13   1.09 -

 AC:     140.6  mm     G. Age:  19w 3d       46  %    FL/BPD:
 FL:      31.3  mm     G. Age:  19w 5d       53  %    FL/AC:      22.3   20 - 24
 HUM:     31.2  mm     G. Age:  20w 3d       76  %

 Est. FW:     294  gm    0 lb 10 oz      47  %
Gestational Age

 Clinical EDD:  19w 3d                                        EDD:   06/17/13
 U/S Today:     19w 1d                                        EDD:   06/19/13
 Best:          19w 3d     Det. By:  Clinical EDD             EDD:   06/17/13
Anatomy

 Cranium:          Appears normal         Ductal Arch:      Not well visualized
 Fetal Cavum:      Appears normal         Diaphragm:        Appears normal
 Ventricles:       Appears normal         Stomach:          Appears normal, left
                                                            sided
 Choroid Plexus:   Not well visualized    Abdomen:          Appears normal
 Cerebellum:       Appears normal         Abdominal Wall:   Appears nml (cord
                                                            insert, abd wall)
 Posterior Fossa:  Appears normal         Cord Vessels:     Appears normal (3
                                                            vessel cord)
 Nuchal Fold:      Not well visualized    Kidneys:          Appear normal
 Lips:             Appears normal         Bladder:          Appears normal
 Heart:            Not well visualized    Spine:            Appears normal
 RVOT:             Not well visualized    Lower             Appears normal
                                          Extremities:
 LVOT:             Not well visualized    Upper             Appears normal
                                          Extremities:
 Aortic Arch:      Not well visualized

 Other:  Female gender. Technically difficult due to  maternal habitus and
         early GA.
Cervix Uterus Adnexa

 Cervical Length:    3.9      cm

 Cervix:       Normal appearance by transabdominal scan.
 Uterus:       No abnormality visualized.

 Left Ovary:    No adnexal mass visualized.
 Right Ovary:   No adnexal mass visualized.
 Adnexa:     No abnormality visualized.
Comments

 The patient's fetal anatomic survey is not complete due to
 fetal position.  However, no gross fetal anomalies were
 identified.  A follow-up ultrasound will be performed in 6
 weeks to reassess fetal growth and anatomy.
Impression

 Single living intrauterine pregnancy at 19 weeks 3 days.
 Appropriate fetal growth (47%).
 Normal amniotic fluid volume.
 No gross fetal anomalies identified.
Recommendations

 Recommend follow-up ultrasound examination in 6 weeks to
 reassess fetal growth and anatomy.

 questions or concerns.
                Cacho, Heavenly

## 2014-08-03 ENCOUNTER — Telehealth: Payer: Self-pay | Admitting: Internal Medicine

## 2014-08-03 NOTE — Telephone Encounter (Signed)
[facility] forward 3 pages to Dr. Dorise HissKollar

## 2014-08-03 NOTE — Telephone Encounter (Signed)
Rec'd from [facility] forward 3 pages to Dr. Kollar °

## 2014-08-17 ENCOUNTER — Encounter: Payer: Self-pay | Admitting: Internal Medicine

## 2014-09-06 ENCOUNTER — Encounter (HOSPITAL_COMMUNITY): Payer: Self-pay | Admitting: *Deleted

## 2014-09-06 ENCOUNTER — Emergency Department (HOSPITAL_COMMUNITY)
Admission: EM | Admit: 2014-09-06 | Discharge: 2014-09-06 | Disposition: A | Discharge status: Home / Self Care | Payer: BLUE CROSS/BLUE SHIELD | Source: Home / Self Care | Attending: Family Medicine | Admitting: Family Medicine

## 2014-09-06 DIAGNOSIS — H8110 Benign paroxysmal vertigo, unspecified ear: Secondary | ICD-10-CM | POA: Diagnosis not present

## 2014-09-06 LAB — POCT I-STAT, CHEM 8
BUN: 4 mg/dL — ABNORMAL LOW (ref 6–20)
Calcium, Ion: 1.14 mmol/L (ref 1.12–1.23)
Chloride: 103 mmol/L (ref 101–111)
Creatinine, Ser: 0.9 mg/dL (ref 0.44–1.00)
Glucose, Bld: 108 mg/dL — ABNORMAL HIGH (ref 65–99)
HCT: 35 % — ABNORMAL LOW (ref 36.0–46.0)
HEMOGLOBIN: 11.9 g/dL — AB (ref 12.0–15.0)
Potassium: 4 mmol/L (ref 3.5–5.1)
Sodium: 137 mmol/L (ref 135–145)
TCO2: 19 mmol/L (ref 0–100)

## 2014-09-06 MED ORDER — MECLIZINE HCL 25 MG PO TABS: 25.0000 mg | ORAL_TABLET | Freq: Three times a day (TID) | ORAL | Status: DC | PRN

## 2014-09-06 MED ORDER — PROMETHAZINE HCL 25 MG PO TABS: 25.0000 mg | ORAL_TABLET | Freq: Four times a day (QID) | ORAL | Status: DC | PRN

## 2014-09-06 NOTE — Discharge Instructions (Signed)
Drink plenty of fluids today, use medicine as needed, see your doctor if further problems

## 2014-09-06 NOTE — ED Provider Notes (Signed)
CSN: [id number]     Arrival date & time 09/06/14  0940 History   First MD Initiated Contact with Patient 09/06/14 1112     Chief Complaint  Patient presents with  . Headache   (Consider location/radiation/quality/duration/timing/severity/associated sxs/prior Treatment) Patient is a 34 y.o. female presenting with fever. The history is provided by the patient.  Fever Max temp prior to arrival:  101 Temp source:  Oral Onset quality:  Sudden Duration:  2 days Chronicity:  New Worsened by:  Nothing tried Ineffective treatments:  None tried Associated symptoms: headaches and nausea   Associated symptoms: no congestion, no cough, no diarrhea, no dysuria, no rash and no rhinorrhea     Past Medical History  Diagnosis Date  . Pneumonia   . Pulmonary edema     after delivery on last child  . GERD (gastroesophageal reflux disease)   . Anemia    Past Surgical History  Procedure Laterality Date  . Closed reduction finger fracture    . Cesarean section      x2  . Cesarean section with bilateral tubal ligation Bilateral 06/11/2013    Procedure: CESAREAN SECTION WITH BILATERAL TUBAL LIGATION;  Surgeon: Loney LaurenceMichelle A Horvath, MD;  Location: [facility];  Service: Obstetrics;  Laterality: Bilateral;   Family History  Problem Relation Age of Onset  . Cancer Maternal Aunt     ovarian  . Cancer Maternal Uncle     throat  . Hyperlipidemia Mother    History  Substance Use Topics  . Smoking status: Former Smoker -- 0.25 packs/day for 17 years    Quit date: 06/09/2012  . Smokeless tobacco: Not on file  . Alcohol Use: No   OB History    Gravida Para Term Preterm AB TAB SAB Ectopic Multiple Living   4 3 1  1  1   3      Review of Systems  Constitutional: Positive for fever and appetite change.  HENT: Negative for congestion and rhinorrhea.   Respiratory: Negative for cough.   Cardiovascular: Negative.   Gastrointestinal: Positive for nausea. Negative for diarrhea.  Endocrine: Positive for  polyuria.  Genitourinary: Positive for frequency. Negative for dysuria.  Skin: Negative for rash.  Neurological: Positive for dizziness and headaches.    Allergies  Review of patient's allergies indicates no known allergies.  Home Medications   Prior to Admission medications   Medication Sig Start Date End Date Taking? Authorizing Provider  meclizine (ANTIVERT) 25 MG tablet Take 1 tablet (25 mg total) by mouth 3 (three) times daily as needed for dizziness. 09/06/14   Linna HoffJames D Andrya Roppolo, MD  Multiple Vitamins-Minerals (ADULT GUMMY) CHEW Chew 1 tablet by mouth daily.    Historical Provider, MD  promethazine (PHENERGAN) 25 MG tablet Take 1 tablet (25 mg total) by mouth every 6 (six) hours as needed for nausea or vomiting. 09/06/14   Linna HoffJames D Chriselda Leppert, MD   BP 151/94 mmHg  Pulse 126  Temp(Src) 101.1 F (38.4 C) (Oral)  Resp 20  SpO2 100%  LMP 08/21/2014 Physical Exam  Constitutional: She is oriented to person, place, and time. She appears well-developed and well-nourished.  Eyes: Conjunctivae and EOM are normal. Pupils are equal, round, and reactive to light.  Neck: Normal range of motion. Neck supple.  Cardiovascular: Regular rhythm, normal heart sounds and normal pulses.  Tachycardia present.   Pulmonary/Chest: Effort normal and breath sounds normal.  Neurological: She is alert and oriented to person, place, and time.  Skin: Skin is warm and dry.  Nursing note and vitals reviewed.   ED Course  Procedures (including critical care time) Labs Review Labs Reviewed  POCT I-STAT, CHEM 8 - Abnormal; Notable for the following:    BUN 4 (*)    Glucose, Bld 108 (*)    Hemoglobin 11.9 (*)    HCT 35.0 (*)    All other components within normal limits   i-stat --hgb 11.1 Imaging Review No results found.   MDM   1. BPV (benign positional vertigo), unspecified laterality        Linna HoffJames D Konica Stankowski, MD 09/06/14 1929

## 2014-09-06 NOTE — ED Notes (Addendum)
Pt  Reports      X  3  Days       With  Dizzyness          Nausea          Vomited  X  2  Days  Ago

## 2014-09-08 ENCOUNTER — Encounter: Payer: Self-pay | Admitting: Family

## 2014-09-08 ENCOUNTER — Ambulatory Visit (INDEPENDENT_AMBULATORY_CARE_PROVIDER_SITE_OTHER): Payer: BLUE CROSS/BLUE SHIELD | Admitting: Family

## 2014-09-08 ENCOUNTER — Other Ambulatory Visit (INDEPENDENT_AMBULATORY_CARE_PROVIDER_SITE_OTHER): Payer: BLUE CROSS/BLUE SHIELD

## 2014-09-08 VITALS — BP 120/84 | HR 100 | Temp 99.2°F | Resp 18 | Ht 65.0 in | Wt 376.0 lb

## 2014-09-08 DIAGNOSIS — R079 Chest pain, unspecified: Secondary | ICD-10-CM | POA: Diagnosis not present

## 2014-09-08 DIAGNOSIS — R42 Dizziness and giddiness: Secondary | ICD-10-CM | POA: Diagnosis not present

## 2014-09-08 LAB — CBC WITH DIFFERENTIAL/PLATELET
BASOS ABS: 0 10*3/uL (ref 0.0–0.1)
Basophils Relative: 0.4 % (ref 0.0–3.0)
Eosinophils Absolute: 0.1 10*3/uL (ref 0.0–0.7)
Eosinophils Relative: 1.5 % (ref 0.0–5.0)
HEMATOCRIT: 31.2 % — AB (ref 36.0–46.0)
Hemoglobin: 10.1 g/dL — ABNORMAL LOW (ref 12.0–15.0)
Lymphocytes Relative: 31.3 % (ref 12.0–46.0)
Lymphs Abs: 1.7 10*3/uL (ref 0.7–4.0)
MCHC: 32.4 g/dL (ref 30.0–36.0)
MCV: 78.7 fl (ref 78.0–100.0)
MONO ABS: 0.5 10*3/uL (ref 0.1–1.0)
Monocytes Relative: 9.7 % (ref 3.0–12.0)
NEUTROS ABS: 3 10*3/uL (ref 1.4–7.7)
Neutrophils Relative %: 57.1 % (ref 43.0–77.0)
PLATELETS: 405 10*3/uL — AB (ref 150.0–400.0)
RBC: 3.96 Mil/uL (ref 3.87–5.11)
RDW: 16.1 % — ABNORMAL HIGH (ref 11.5–15.5)
WBC: 5.3 10*3/uL (ref 4.0–10.5)

## 2014-09-08 MED ORDER — AMOXICILLIN-POT CLAVULANATE 875-125 MG PO TABS: 1.0000 | ORAL_TABLET | Freq: Two times a day (BID) | ORAL | Status: DC

## 2014-09-08 NOTE — Assessment & Plan Note (Addendum)
Dizziness of undetermined origin. In office ECG reveals sinus tachycardia.  Obtain CBC to rule out infection. Given symptoms of fever, start Augmentin to cover for infection. Continue meclizine as needed for dizziness. If symptoms worsen or fail to improve with antibiotic regimen referred to physical therapy for treatment of BPPV.

## 2014-09-08 NOTE — Progress Notes (Signed)
   Subjective:    Patient ID: Cheyenne Lyons, female    DOB: 08/21/1980, 34 y.o.   MRN: [id number]  Chief Complaint  Patient presents with  . Dizziness    x5 days having a headache, pain around both sides of jaw to ear, dizziness, and does have chest pain when taking a deep breath    HPI:  Cheyenne Lyons is a 34 y.o. female with a PMH of morbid obesity and endometriosis who presents today for an office follow up.   Associated symptoms of pain located around both sides of her jaw, and dizziness also combined with pain during a deep breath and headache has been going on for about 5 days. Modifying factors include meclozine and phenergan which have helped minimally. States the dizziness is making her feel heavy on 1 side. Symptoms are worse on the left side compared to the right. Dizziness is described as the room spinning. Denies any recent antibiotic use. Has not been eating a lot, but drinking plenty water.   She was seen in the ED previously and diagnosed with BPPV.   No Known Allergies   Current Outpatient Prescriptions on File Prior to Visit  Medication Sig Dispense Refill  . meclizine (ANTIVERT) 25 MG tablet Take 1 tablet (25 mg total) by mouth 3 (three) times daily as needed for dizziness. 30 tablet 0  . Multiple Vitamins-Minerals (ADULT GUMMY) CHEW Chew 1 tablet by mouth daily.    . promethazine (PHENERGAN) 25 MG tablet Take 1 tablet (25 mg total) by mouth every 6 (six) hours as needed for nausea or vomiting. 10 tablet 0   No current facility-administered medications on file prior to visit.    Review of Systems  Constitutional: Positive for fever. Negative for chills.  HENT: Positive for ear pain. Negative for sinus pressure and sore throat.   Respiratory: Negative for cough, chest tightness and shortness of breath.   Cardiovascular: Positive for chest pain. Negative for palpitations and leg swelling.  Neurological: Negative for dizziness.      Objective:    BP 120/84 mmHg   Pulse 100  Temp(Src) 99.2 F (37.3 C) (Oral)  Resp 18  Ht 5\' 5"  (1.651 m)  Wt 376 lb (170.552 kg)  BMI 62.57 kg/m2  SpO2 98%  LMP 08/21/2014 Nursing note and vital signs reviewed.  Physical Exam  Constitutional: She is oriented to person, place, and time. She appears well-developed and well-nourished. No distress.  HENT:  Right Ear: Hearing, tympanic membrane, external ear and ear canal normal.  Left Ear: Hearing, tympanic membrane, external ear and ear canal normal.  Nose: Right sinus exhibits no maxillary sinus tenderness and no frontal sinus tenderness. Left sinus exhibits no maxillary sinus tenderness and no frontal sinus tenderness.  Mouth/Throat: Uvula is midline, oropharynx is clear and moist and mucous membranes are normal.  Eyes: Conjunctivae are normal. Pupils are equal, round, and reactive to light.  Cardiovascular: Normal rate, regular rhythm, normal heart sounds and intact distal pulses.   Pulmonary/Chest: Effort normal and breath sounds normal.  Neurological: She is alert and oriented to person, place, and time. She has normal reflexes. No cranial nerve deficit. She exhibits normal muscle tone. Coordination normal.  Skin: Skin is warm and dry.  Psychiatric: She has a normal mood and affect. Her behavior is normal. Judgment and thought content normal.       Assessment & Plan:

## 2014-09-08 NOTE — Patient Instructions (Signed)

## 2014-09-08 NOTE — Progress Notes (Signed)
Pre visit review using our clinic review tool, if applicable. No additional management support is needed unless otherwise documented below in the visit note. 

## 2014-12-10 ENCOUNTER — Telehealth: Payer: Self-pay | Admitting: Internal Medicine

## 2014-12-10 NOTE — Telephone Encounter (Signed)
Rec'd from [facility] forward 5 pages to Dr.Kollar

## 2014-12-17 ENCOUNTER — Other Ambulatory Visit: Payer: Self-pay | Admitting: Obstetrics and Gynecology

## 2014-12-31 ENCOUNTER — Encounter (HOSPITAL_COMMUNITY): Payer: Self-pay

## 2015-01-05 NOTE — H&P (Signed)
34 y.o. yo complains of a hard mass near old C/S scar and menorrhagia.   [place] proves a mass that is also palpable- not cystic but may be either incisional endometriosis or fat necrosis.  Arrea appears solid on [place].  Past Medical History  Diagnosis Date  . Pneumonia   . Pulmonary edema     after delivery on last child  . Anemia   . GERD (gastroesophageal reflux disease)     with pregnancy  . Tendonitis     right thumb   Past Surgical History  Procedure Laterality Date  . Closed reduction finger fracture    . Cesarean section      x2  . Cesarean section with bilateral tubal ligation Bilateral 06/11/2013    Procedure: CESAREAN SECTION WITH BILATERAL TUBAL LIGATION;  Surgeon: Loney Laurence, MD;  Location: [facility];  Service: Obstetrics;  Laterality: Bilateral;    Social History   Social History  . Marital Status: Married    Spouse Name: N/A  . Number of Children: N/A  . Years of Education: N/A   Occupational History  . Not on file.   Social History Main Topics  . Smoking status: Former Smoker -- 0.25 packs/day for 17 years    Quit date: 06/09/2012  . Smokeless tobacco: Never Used  . Alcohol Use: No  . Drug Use: No  . Sexual Activity: Not on file   Other Topics Concern  . Not on file   Social History Narrative    No current facility-administered medications on file prior to encounter.   Current Outpatient Prescriptions on File Prior to Encounter  Medication Sig Dispense Refill  . amoxicillin-clavulanate (AUGMENTIN) 875-125 MG per tablet Take 1 tablet by mouth 2 (two) times daily. (Patient not taking: Reported on 12/26/2014) 20 tablet 0  . meclizine (ANTIVERT) 25 MG tablet Take 1 tablet (25 mg total) by mouth 3 (three) times daily as needed for dizziness. (Patient not taking: Reported on 12/26/2014) 30 tablet 0  . promethazine (PHENERGAN) 25 MG tablet Take 1 tablet (25 mg total) by mouth every 6 (six) hours as needed for nausea or vomiting. (Patient not taking: Reported on  12/26/2014) 10 tablet 0    No Known Allergies  @  Lungs: clear to ascultation Cor:  RRR Abdomen:  soft, nontender, nondistended.  Tender 3 cm area just above scar with palpable nodule. Ex:  no cords, erythema Pelvic:  NEFG, normal size uterus.  No masses.   [place] 9x4x5, EM 1.4 cm; normal ovaries.  Nodule in scar as above.  A:  For d&c, hysteroscopy, Novasure.  Also will excise nodule with a skin incision in old scar.     P: All risks, benefits and alternatives d/w patient and she desires to proceed.  Patient will have SCDs during the operation.     Xanthe Couillard A

## 2015-01-06 ENCOUNTER — Ambulatory Visit (HOSPITAL_COMMUNITY)
Admission: RE | Admit: 2015-01-06 | Discharge: 2015-01-06 | Disposition: A | Discharge status: Home / Self Care | Payer: BLUE CROSS/BLUE SHIELD | Source: Ambulatory Visit | Attending: Obstetrics and Gynecology | Admitting: Obstetrics and Gynecology

## 2015-01-06 ENCOUNTER — Ambulatory Visit (HOSPITAL_COMMUNITY): Payer: BLUE CROSS/BLUE SHIELD | Admitting: Anesthesiology

## 2015-01-06 ENCOUNTER — Encounter (HOSPITAL_COMMUNITY)
Admission: RE | Disposition: A | Discharge status: Home / Self Care | Payer: Self-pay | Source: Ambulatory Visit | Attending: Obstetrics and Gynecology

## 2015-01-06 ENCOUNTER — Encounter (HOSPITAL_COMMUNITY): Payer: Self-pay | Admitting: Emergency Medicine

## 2015-01-06 DIAGNOSIS — N8500 Endometrial hyperplasia, unspecified: Secondary | ICD-10-CM | POA: Insufficient documentation

## 2015-01-06 DIAGNOSIS — Z87891 Personal history of nicotine dependence: Secondary | ICD-10-CM | POA: Insufficient documentation

## 2015-01-06 DIAGNOSIS — Z6841 Body Mass Index (BMI) 40.0 and over, adult: Secondary | ICD-10-CM | POA: Insufficient documentation

## 2015-01-06 DIAGNOSIS — R222 Localized swelling, mass and lump, trunk: Secondary | ICD-10-CM | POA: Diagnosis not present

## 2015-01-06 DIAGNOSIS — N920 Excessive and frequent menstruation with regular cycle: Secondary | ICD-10-CM | POA: Insufficient documentation

## 2015-01-06 HISTORY — PX: MASS EXCISION: SHX2000

## 2015-01-06 HISTORY — DX: Enthesopathy, unspecified: M77.9

## 2015-01-06 HISTORY — PX: HYSTEROSCOPY WITH NOVASURE: SHX5574

## 2015-01-06 LAB — PREGNANCY, URINE: PREG TEST UR: NEGATIVE

## 2015-01-06 LAB — CBC
HCT: 28.8 % — ABNORMAL LOW (ref 36.0–46.0)
Hemoglobin: 9 g/dL — ABNORMAL LOW (ref 12.0–15.0)
MCH: 24.3 pg — ABNORMAL LOW (ref 26.0–34.0)
MCHC: 31.3 g/dL (ref 30.0–36.0)
MCV: 77.6 fL — ABNORMAL LOW (ref 78.0–100.0)
PLATELETS: 442 10*3/uL — AB (ref 150–400)
RBC: 3.71 MIL/uL — ABNORMAL LOW (ref 3.87–5.11)
RDW: 16.6 % — AB (ref 11.5–15.5)
WBC: 4.9 10*3/uL (ref 4.0–10.5)

## 2015-01-06 SURGERY — HYSTEROSCOPY WITH NOVASURE
Anesthesia: Spinal | Site: Vagina

## 2015-01-06 MED ORDER — FENTANYL CITRATE (PF) 100 MCG/2ML IJ SOLN
INTRAMUSCULAR | Status: DC
Start: 2015-01-06 — End: 2015-01-06
  Filled 2015-01-06: qty 2

## 2015-01-06 MED ORDER — ACETAMINOPHEN 160 MG/5ML PO SOLN
ORAL | Status: AC
  Administered 2015-01-06: 975 mg via ORAL
  Filled 2015-01-06: qty 40.6

## 2015-01-06 MED ORDER — MIDAZOLAM HCL 5 MG/5ML IJ SOLN
INTRAMUSCULAR | Status: DC | PRN
  Administered 2015-01-06 (×2): 2 mg via INTRAVENOUS

## 2015-01-06 MED ORDER — CHLOROPROCAINE HCL 1 % IJ SOLN
INTRAMUSCULAR | Status: DC | PRN
  Administered 2015-01-06 (×3): 5 mL
  Administered 2015-01-06: 3 mL

## 2015-01-06 MED ORDER — LACTATED RINGERS IR SOLN
Status: DC | PRN
  Administered 2015-01-06: 3000 mL

## 2015-01-06 MED ORDER — LIDOCAINE-EPINEPHRINE (PF) 2 %-1:200000 IJ SOLN
INTRAMUSCULAR | Status: AC
  Filled 2015-01-06: qty 20

## 2015-01-06 MED ORDER — FENTANYL CITRATE (PF) 100 MCG/2ML IJ SOLN
25.0000 ug | INTRAMUSCULAR | Status: DC | PRN
  Administered 2015-01-06 (×3): 50 ug via INTRAVENOUS

## 2015-01-06 MED ORDER — DEXTROSE 5 % IV SOLN
INTRAVENOUS | Status: AC
  Filled 2015-01-06: qty 3000

## 2015-01-06 MED ORDER — LIDOCAINE HCL 1 % IJ SOLN
INTRAMUSCULAR | Status: AC
  Filled 2015-01-06: qty 20

## 2015-01-06 MED ORDER — FENTANYL CITRATE (PF) 100 MCG/2ML IJ SOLN
INTRAMUSCULAR | Status: AC
  Filled 2015-01-06: qty 4

## 2015-01-06 MED ORDER — SCOPOLAMINE 1 MG/3DAYS TD PT72
MEDICATED_PATCH | TRANSDERMAL | Status: AC
  Administered 2015-01-06: 1.5 mg via TRANSDERMAL
  Filled 2015-01-06: qty 1

## 2015-01-06 MED ORDER — FENTANYL CITRATE (PF) 100 MCG/2ML IJ SOLN
INTRAMUSCULAR | Status: DC | PRN
  Administered 2015-01-06 (×2): 50 ug via INTRAVENOUS

## 2015-01-06 MED ORDER — KETOROLAC TROMETHAMINE 30 MG/ML IJ SOLN
INTRAMUSCULAR | Status: DC | PRN
  Administered 2015-01-06: 30 mg via INTRAVENOUS

## 2015-01-06 MED ORDER — LIDOCAINE HCL (CARDIAC) 20 MG/ML IV SOLN
INTRAVENOUS | Status: AC
  Filled 2015-01-06: qty 5

## 2015-01-06 MED ORDER — PROPOFOL 10 MG/ML IV BOLUS
INTRAVENOUS | Status: DC | PRN
  Administered 2015-01-06: 250 mg via INTRAVENOUS

## 2015-01-06 MED ORDER — ONDANSETRON HCL 4 MG/2ML IJ SOLN
INTRAMUSCULAR | Status: DC | PRN
  Administered 2015-01-06: 4 mg via INTRAVENOUS

## 2015-01-06 MED ORDER — MIDAZOLAM HCL 2 MG/2ML IJ SOLN
INTRAMUSCULAR | Status: AC
  Filled 2015-01-06: qty 4

## 2015-01-06 MED ORDER — BUPIVACAINE HCL (PF) 0.25 % IJ SOLN
INTRAMUSCULAR | Status: AC
  Filled 2015-01-06: qty 30

## 2015-01-06 MED ORDER — DEXAMETHASONE SODIUM PHOSPHATE 4 MG/ML IJ SOLN
INTRAMUSCULAR | Status: DC | PRN
  Administered 2015-01-06: 4 mg via INTRAVENOUS

## 2015-01-06 MED ORDER — LIDOCAINE HCL (PF) 1 % IJ SOLN
INTRAMUSCULAR | Status: AC
  Filled 2015-01-06: qty 5

## 2015-01-06 MED ORDER — DEXAMETHASONE SODIUM PHOSPHATE 4 MG/ML IJ SOLN
INTRAMUSCULAR | Status: AC
  Filled 2015-01-06: qty 1

## 2015-01-06 MED ORDER — SODIUM BICARBONATE 8.4 % IV SOLN
INTRAVENOUS | Status: AC
  Filled 2015-01-06: qty 50

## 2015-01-06 MED ORDER — OXYCODONE-ACETAMINOPHEN 5-325 MG PO TABS: 1.0000 | ORAL_TABLET | ORAL | Status: DC | PRN

## 2015-01-06 MED ORDER — ONDANSETRON HCL 4 MG/2ML IJ SOLN
INTRAMUSCULAR | Status: AC
  Filled 2015-01-06: qty 2

## 2015-01-06 MED ORDER — PROPOFOL 10 MG/ML IV BOLUS
INTRAVENOUS | Status: AC
  Filled 2015-01-06: qty 20

## 2015-01-06 MED ORDER — ACETAMINOPHEN 160 MG/5ML PO SOLN
975.0000 mg | Freq: Once | ORAL | Status: AC
  Administered 2015-01-06: 975 mg via ORAL

## 2015-01-06 MED ORDER — SCOPOLAMINE 1 MG/3DAYS TD PT72
1.0000 | MEDICATED_PATCH | Freq: Once | TRANSDERMAL | Status: DC
  Administered 2015-01-06: 1.5 mg via TRANSDERMAL

## 2015-01-06 MED ORDER — CEFAZOLIN SODIUM 10 G IJ SOLR
3.0000 g | INTRAMUSCULAR | Status: DC | PRN
  Administered 2015-01-06: 3 g via INTRAVENOUS

## 2015-01-06 MED ORDER — PROPOFOL INFUSION 10 MG/ML OPTIME: INTRAVENOUS | Status: DC | PRN

## 2015-01-06 MED ORDER — BUPIVACAINE HCL (PF) 0.25 % IJ SOLN
INTRAMUSCULAR | Status: DC | PRN
  Administered 2015-01-06: 10 mL

## 2015-01-06 MED ORDER — LIDOCAINE HCL (CARDIAC) 20 MG/ML IV SOLN
INTRAVENOUS | Status: DC | PRN
  Administered 2015-01-06: 100 mg via INTRAVENOUS

## 2015-01-06 MED ORDER — KETOROLAC TROMETHAMINE 30 MG/ML IJ SOLN
INTRAMUSCULAR | Status: AC
  Filled 2015-01-06: qty 1

## 2015-01-06 MED ORDER — LACTATED RINGERS IV SOLN
INTRAVENOUS | Status: DC
  Administered 2015-01-06 (×2): via INTRAVENOUS

## 2015-01-06 SURGICAL SUPPLY — 22 items
ABLATOR ENDOMETRIAL BIPOLAR (ABLATOR) ×4 IMPLANT
CANISTER SUCT 3000ML (MISCELLANEOUS) ×4 IMPLANT
CATH ROBINSON RED A/P 16FR (CATHETERS) ×4 IMPLANT
CLOTH BEACON ORANGE TIMEOUT ST (SAFETY) ×4 IMPLANT
CONTAINER PREFILL 10% NBF 60ML (FORM) ×8 IMPLANT
ELECT REM PT RETURN 9FT ADLT (ELECTROSURGICAL) ×4
ELECTRODE REM PT RTRN 9FT ADLT (ELECTROSURGICAL) IMPLANT
GLOVE BIO SURGEON STRL SZ7 (GLOVE) ×8 IMPLANT
GOWN STRL REUS W/TWL LRG LVL3 (GOWN DISPOSABLE) ×8 IMPLANT
LOOP ANGLED CUTTING 22FR (CUTTING LOOP) IMPLANT
PACK VAGINAL MINOR WOMEN LF (CUSTOM PROCEDURE TRAY) ×4 IMPLANT
PAD OB MATERNITY 4.3X12.25 (PERSONAL CARE ITEMS) ×4 IMPLANT
PENCIL BUTTON HOLSTER BLD 10FT (ELECTRODE) ×2 IMPLANT
SHEET LAVH (DRAPES) ×2 IMPLANT
SYR BULB IRRIGATION 50ML (SYRINGE) ×2 IMPLANT
TOWEL OR 17X24 6PK STRL BLUE (TOWEL DISPOSABLE) ×8 IMPLANT
TUBING AQUILEX INFLOW (TUBING) ×4 IMPLANT
TUBING AQUILEX OUTFLOW (TUBING) ×4 IMPLANT
TUBING NON-CON 1/4 X 20 CONN (TUBING) ×1 IMPLANT
TUBING NON-CON 1/4 X 20' CONN (TUBING) ×1
WATER STERILE IRR 1000ML POUR (IV SOLUTION) ×4 IMPLANT
YANKAUER SUCT BULB TIP NO VENT (SUCTIONS) ×2 IMPLANT

## 2015-01-06 NOTE — Brief Op Note (Signed)
01/06/2015  12:17 PM  PATIENT:  Cheyenne Lyons  34 y.o. female  PRE-OPERATIVE DIAGNOSIS:  ENDOMETRIUM HYPERPLASIA  POST-OPERATIVE DIAGNOSIS:  ENDOMETRIUM HYPERPLASIA  PROCEDURE:  Procedure(s): HYSTEROSCOPY WITH NOVASURE (N/A) EXCISION OF INCISION ABDONIMAL NODULE (N/A)  SURGEON:  Surgeon(s) and Role:    * Carrington Clamp, MD - Primary   ANESTHESIA:   general  EBL:  Total I/O In: 1300 [I.V.:1300] Out: -   SPECIMEN:  Source of Specimen:  excision of nodule in incision, uterine currettings  DISPOSITION OF SPECIMEN:  PATHOLOGY  COUNTS:  YES  TOURNIQUET:  * No tourniquets in log *  DICTATION: .Note written in EPIC  PLAN OF CARE: Discharge to home after PACU  PATIENT DISPOSITION:  PACU - hemodynamically stable.   Delay start of Pharmacological VTE agent (>24hrs) due to surgical blood loss or risk of bleeding: not applicable  Findings:  Shaggy endometrium.  Medications: none.  Complications: 5 mm superficial epidermis skin burn with the tip of the bovie just above abdominal incision site.  This was covered with a steri strip.   After adequate anesthesia was achieved, the patient was prepped and draped in the usual sterile fashion.   A 4 cm skin incision was made in the ride side of the old C/S scar just above nodule.  A firm but not discreet nodule was palpated and removed with the bovie cautery in pieces carefully.  At the level of the fascia, the bottom of the nodule was excised.  A stitch of 0 vicryl was used to reinforce the fascia at that point with a figure of eight stitch.  The subcutaneous fat was reapproximated with 3-0 vicryl R and the skin was closed with 4-0 vicryl R with a keith needle.  The incision was covered with steri strips and a honeycomb dressing.  The speculum was placed in the vagina and the cervix stabilized with a single-tooth tenaculum.  The cervix was dilated with pratt dilators and the hysteroscope passed inside the endometrial cavity.  The above  findings were noted and sharp curettage was then performed and uterine curettings sent to path.  The cavity length was 6.5 cm and the Novasure instrument successfully seated.  The width was 2.9 cm and the CO2 test passed.  The ablation went for 80 sec at at power of 104 watts.  Once the ablation was completed, the Novasure instrument was removed and the hysteroscope passed into the cavity again.  Good contact was seen in all areas all.   All instruments were then removed from the uterus and vagina.  The patient tolerated the procedure well.    Cheyenne Lyons A

## 2015-01-06 NOTE — Anesthesia Preprocedure Evaluation (Addendum)
Anesthesia Evaluation  Patient identified by MRN, date of birth, ID band Patient awake    Reviewed: Allergy & Precautions, H&P , Patient's Chart, lab work & pertinent test results, reviewed documented beta blocker date and time   Airway Mallampati: II  TM Distance: >3 FB Neck ROM: full    Dental no notable dental hx.    Pulmonary former smoker,    Pulmonary exam normal breath sounds clear to auscultation       Cardiovascular Exercise Tolerance: Good  Rhythm:regular Rate:Normal     Neuro/Psych    GI/Hepatic GERD  ,  Endo/Other  Morbid obesity  Renal/GU      Musculoskeletal   Abdominal   Peds  Hematology  (+) anemia ,   Anesthesia Other Findings   Reproductive/Obstetrics                            Anesthesia Physical Anesthesia Plan  ASA: III  Anesthesia Plan: Spinal   Post-op Pain Management:    Induction: Intravenous  Airway Management Planned: LMA  Additional Equipment:   Intra-op Plan:   Post-operative Plan:   Informed Consent: I have reviewed the patients History and Physical, chart, labs and discussed the procedure including the risks, benefits and alternatives for the proposed anesthesia with the patient or authorized representative who has indicated his/her understanding and acceptance.   Dental Advisory Given and Dental advisory given  Plan Discussed with: CRNA and Surgeon  Anesthesia Plan Comments: (Discussed GA with LMA, possible sore throat, potential need to switch to ETT, N/V, pulmonary aspiration. Questions answered. )       Anesthesia Quick Evaluation

## 2015-01-06 NOTE — Anesthesia Postprocedure Evaluation (Signed)
  Anesthesia Post-op Note  Patient: Cheyenne Lyons  Procedure(s) Performed: Procedure(s) (LRB): HYSTEROSCOPY WITH NOVASURE (N/A) EXCISION OF INCISION ABDONIMAL NODULE (N/A)  Patient Location: PACU  Anesthesia Type: General  Level of Consciousness: awake and alert   Airway and Oxygen Therapy: Patient Spontanous Breathing  Post-op Pain: mild  Post-op Assessment: Post-op Vital signs reviewed, Patient's Cardiovascular Status Stable, Respiratory Function Stable, Patent Airway and No signs of Nausea or vomiting  Last Vitals:  Filed Vitals:   01/06/15 1400  BP: 137/78  Pulse: 67  Temp: 36.6 C  Resp: 12    Post-op Vital Signs: stable   Complications: No apparent anesthesia complications

## 2015-01-06 NOTE — Transfer of Care (Signed)
Immediate Anesthesia Transfer of Care Note  Patient: Cheyenne Lyons  Procedure(s) Performed: Procedure(s): HYSTEROSCOPY WITH NOVASURE (N/A) EXCISION OF INCISION ABDONIMAL NODULE (N/A)  Patient Location: PACU  Anesthesia Type:General  Level of Consciousness: awake, alert  and oriented  Airway & Oxygen Therapy: Patient Spontanous Breathing and Patient connected to nasal cannula oxygen  Post-op Assessment: Report given to RN and Post -op Vital signs reviewed and stable  Post vital signs: Reviewed and stable  Last Vitals:  Filed Vitals:   01/06/15 0911  BP: 166/83  Pulse: 87  Temp: 36.7 C  Resp: 16    Complications: No apparent anesthesia complications

## 2015-01-06 NOTE — Anesthesia Procedure Notes (Addendum)
Epidural Patient location during procedure: OB  Preanesthetic Checklist Completed: patient identified, site marked, surgical consent, pre-op evaluation, timeout performed, IV checked, risks and benefits discussed and monitors and equipment checked  Epidural Patient position: sitting Prep: site prepped and draped and DuraPrep Patient monitoring: continuous pulse ox and blood pressure Approach: midline Location: L3-L4 Injection technique: LOR air  Needle:  Needle type: Tuohy  Needle gauge: 17 G Needle length: 15 cm and 13 Needle insertion depth: 5 cm cm Catheter type: closed end flexible Catheter size: 19 Gauge Catheter at skin depth: 20 cm Test dose: negative  Assessment Events: blood not aspirated, injection not painful, no injection resistance, negative IV test and no paresthesia  Additional Notes Attempted SAB No luck with 9cm tuohy LOR at 13 cm but no CSF achieved, transient R paresthesia noted twice with tuohy repositioned inbetween. SAB abandoned; will switch to epidural (-) SAB test dose of 3cc 3%2CP  Procedure Name: LMA Insertion Date/Time: 01/06/2015 11:17 AM Performed by: Junious Silk Pre-anesthesia Checklist: Patient identified, Timeout performed, Emergency Drugs available, Suction available and Patient being monitored Patient Re-evaluated:Patient Re-evaluated prior to inductionOxygen Delivery Method: Circle system utilized Preoxygenation: Pre-oxygenation with 100% oxygen Intubation Type: IV induction LMA: LMA with gastric port inserted LMA Size: 3.0 Number of attempts: 1 Placement Confirmation: CO2 detector,  positive ETCO2 and breath sounds checked- equal and bilateral Tube secured with: Tape Dental Injury: Teeth and Oropharynx as per pre-operative assessment

## 2015-01-06 NOTE — Op Note (Signed)
01/06/2015  12:17 PM  PATIENT:  Cheyenne Lyons  34 y.o. female  PRE-OPERATIVE DIAGNOSIS:  ENDOMETRIUM HYPERPLASIA  POST-OPERATIVE DIAGNOSIS:  ENDOMETRIUM HYPERPLASIA  PROCEDURE:  Procedure(s): HYSTEROSCOPY WITH NOVASURE (N/A) EXCISION OF INCISION ABDONIMAL NODULE (N/A)  SURGEON:  Surgeon(s) and Role:    * Carrington Clamp, MD - Primary   ANESTHESIA:   general  EBL:  Total I/O In: 1300 [I.V.:1300] Out: -   SPECIMEN:  Source of Specimen:  excision of nodule in incision, uterine currettings  DISPOSITION OF SPECIMEN:  PATHOLOGY  COUNTS:  YES  TOURNIQUET:  * No tourniquets in log *  DICTATION: .Note written in EPIC  PLAN OF CARE: Discharge to home after PACU  PATIENT DISPOSITION:  PACU - hemodynamically stable.   Delay start of Pharmacological VTE agent (>24hrs) due to surgical blood loss or risk of bleeding: not applicable  Findings:  Shaggy endometrium.  Medications: none.  Complications: 5 mm superficial epidermis skin burn with the tip of the bovie just above abdominal incision site.  This was covered with a steri strip.   After adequate anesthesia was achieved, the patient was prepped and draped in the usual sterile fashion.   A 4 cm skin incision was made in the ride side of the old C/S scar just above nodule.  A firm but not discreet nodule was palpated and removed with the bovie cautery in pieces carefully.  At the level of the fascia, the bottom of the nodule was excised.  A stitch of 0 vicryl was used to reinforce the fascia at that point with a figure of eight stitch.  The subcutaneous fat was reapproximated with 3-0 vicryl R and the skin was closed with 4-0 vicryl R with a keith needle.  The incision was covered with steri strips and a honeycomb dressing.  The speculum was placed in the vagina and the cervix stabilized with a single-tooth tenaculum.  The cervix was dilated with pratt dilators and the hysteroscope passed inside the endometrial cavity.  The above  findings were noted and sharp curettage was then performed and uterine curettings sent to path.  The cavity length was 6.5 cm and the Novasure instrument successfully seated.  The width was 2.9 cm and the CO2 test passed.  The ablation went for 80 sec at at power of 104 watts.  Once the ablation was completed, the Novasure instrument was removed and the hysteroscope passed into the cavity again.  Good contact was seen in all areas all.   All instruments were then removed from the uterus and vagina.  The patient tolerated the procedure well.    Cheyenne Lyons

## 2015-01-07 ENCOUNTER — Encounter (HOSPITAL_COMMUNITY): Payer: Self-pay | Admitting: Obstetrics and Gynecology

## 2015-01-20 ENCOUNTER — Ambulatory Visit (INDEPENDENT_AMBULATORY_CARE_PROVIDER_SITE_OTHER): Payer: BLUE CROSS/BLUE SHIELD | Admitting: Internal Medicine

## 2015-01-20 ENCOUNTER — Encounter: Payer: Self-pay | Admitting: Internal Medicine

## 2015-01-20 VITALS — BP 144/82 | HR 90 | Temp 98.3°F | Resp 14 | Ht 65.0 in | Wt 380.8 lb

## 2015-01-20 DIAGNOSIS — D509 Iron deficiency anemia, unspecified: Secondary | ICD-10-CM

## 2015-01-20 DIAGNOSIS — IMO0001 Reserved for inherently not codable concepts without codable children: Secondary | ICD-10-CM

## 2015-01-20 DIAGNOSIS — R51 Headache: Secondary | ICD-10-CM

## 2015-01-20 DIAGNOSIS — R030 Elevated blood-pressure reading, without diagnosis of hypertension: Secondary | ICD-10-CM

## 2015-01-20 DIAGNOSIS — R519 Headache, unspecified: Secondary | ICD-10-CM

## 2015-01-20 MED ORDER — LISINOPRIL-HYDROCHLOROTHIAZIDE 10-12.5 MG PO TABS: 1.0000 | ORAL_TABLET | Freq: Every day | ORAL | Status: DC

## 2015-01-20 NOTE — Progress Notes (Signed)
Pre visit review using our clinic review tool, if applicable. No additional management support is needed unless otherwise documented below in the visit note. 

## 2015-01-20 NOTE — Progress Notes (Signed)
   Subjective:    Patient ID: Cheyenne Lyons, female    DOB: Dec 17, 1980, 34 y.o.   MRN: [id number]  HPI   At her Gynecologist's office she was found to have blood pressure 140/100 and 153/98. It was recommended she be seen to address this by her primary care MD.     Her diet is not heart healthy as she eats red meats, fried foods, and salt. She does not exercise.  She describes bitemporal headache and paranasal discomfort in the last week. It's been constant up to level VIII/X, she takes Tylenol, ibuprofen, or Percocet prescribed postoperatively. She has no associated neurologic symptoms or signs with this except for some numbness and tingling in her hands.   Family history is strongly positive for hypertension in her mother, maternal aunt, maternal grandmother, and paternal grandmother. Her maternal uncle had a heart attack at 81. There is no family history of stroke.    She had hysterectomy 9/ 14/16 for endometriosis. Prior to hysterectomy she had had a progressive anemia. In May  Hematocrit was 31.2 ; it was 28.8 on 01/06/15. She is not on iron. Renal function has been normal.  Review of Systems  Chest pain, palpitations, tachycardia, exertional dyspnea, paroxysmal nocturnal dyspnea, claudication or edema are absent. Fever, chills, sweats, or unexplained weight loss not present. Mental status change or memory loss denied. Blurred vision , diplopia or vision loss absent. Vertigo, near syncope or imbalance denied. No loss of control of bladder or bowels. Radicular type pain absent. No seizure stigmata.     Objective:   Physical Exam  Pertinent or positive findings include: BMI 63.37. Cranial nerve exam normal. Romberg and finger-nose testing within normal limits.  General appearance :adequately nourished; in no distress.  Eyes: No conjunctival inflammation or scleral icterus is present.EOM & FOV WNL  Oral exam:  Lips and gums are healthy appearing.There is no oropharyngeal erythema  or exudate noted. Dental hygiene is good.  Heart:  Normal rate and regular rhythm. S1 and S2 normal without gallop, murmur, click, rub or other extra sounds    Lungs:Chest clear to auscultation; no wheezes, rhonchi,rales ,or rubs present.No increased work of breathing.   Abdomen: Protuberant;bowel sounds normal, soft and non-tender without masses, organomegaly or hernias noted.  No guarding or rebound. No flank tenderness to percussion.  Vascular : all pulses equal ; no bruits present.  Skin:Warm & dry.  Intact without suspicious lesions or rashes ; no tenting or jaundice   Lymphatic: No lymphadenopathy is noted about the head, neck, axilla   Neuro: Strength, tone & DTRs normal.      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

## 2015-01-20 NOTE — Patient Instructions (Addendum)
Avoid ingestion of  excess salt/sodium.Cook with pepper & other spices . Use the salt substitute "No Salt" OR the Mrs Sharilyn Sites products to season food @ the table. Avoid foods which taste salty or "vinegary" as their sodium content will be high. Minimal Blood Pressure Goal= AVERAGE < 140/90;  Ideal is an AVERAGE < 135/85. This AVERAGE should be calculated from @ least 5-7 BP readings taken @ different times of day on different days of week. You should not respond to isolated BP readings , but rather the AVERAGE for that week .Please bring your  blood pressure cuff to office visits to verify that it is reliable.It  can also be checked against the blood pressure device at the pharmacy. Finger or wrist cuffs are not dependable; an arm cuff is.  Fill the  prescription for the BP medication if BP NOT @ goal based on  7 to 14 day average.  Take Multivitamin WITH iron daily as discussed. The most important intervention is still a  heart healthy nutritional program as Dr Gildardo Griffes Eat, Drink & Be Healthy or a Mediaterranean type diet with with lots of fruits and vegetables, up to 7-9 servings per day. Consume less than 30 grams of sugar per day from foods & drinks with High Fructose Corn Sugar as #1,2,3 or # 4 on label.

## 2015-01-22 ENCOUNTER — Telehealth: Payer: Self-pay | Admitting: Internal Medicine

## 2015-01-22 NOTE — Telephone Encounter (Signed)
Rec'd from [facility] forward 5 pages to Dr.Hopper °

## 2015-09-28 ENCOUNTER — Other Ambulatory Visit: Payer: Self-pay | Admitting: *Deleted

## 2015-09-28 DIAGNOSIS — R030 Elevated blood-pressure reading, without diagnosis of hypertension: Principal | ICD-10-CM

## 2015-09-28 DIAGNOSIS — IMO0001 Reserved for inherently not codable concepts without codable children: Secondary | ICD-10-CM

## 2015-09-28 MED ORDER — LISINOPRIL-HYDROCHLOROTHIAZIDE 10-12.5 MG PO TABS: 1.0000 | ORAL_TABLET | Freq: Every day | ORAL | Status: DC

## 2016-02-20 ENCOUNTER — Other Ambulatory Visit: Payer: Self-pay | Admitting: Internal Medicine

## 2017-04-29 ENCOUNTER — Emergency Department (HOSPITAL_COMMUNITY): Payer: BLUE CROSS/BLUE SHIELD

## 2017-04-29 ENCOUNTER — Encounter (HOSPITAL_COMMUNITY): Payer: Self-pay | Admitting: Emergency Medicine

## 2017-04-29 ENCOUNTER — Emergency Department (HOSPITAL_COMMUNITY)
Admission: EM | Admit: 2017-04-29 | Discharge: 2017-04-29 | Disposition: A | Discharge status: Home / Self Care | Payer: BLUE CROSS/BLUE SHIELD | Attending: Emergency Medicine | Admitting: Emergency Medicine

## 2017-04-29 DIAGNOSIS — Z87891 Personal history of nicotine dependence: Secondary | ICD-10-CM | POA: Insufficient documentation

## 2017-04-29 DIAGNOSIS — I10 Essential (primary) hypertension: Secondary | ICD-10-CM | POA: Diagnosis not present

## 2017-04-29 DIAGNOSIS — N760 Acute vaginitis: Secondary | ICD-10-CM | POA: Insufficient documentation

## 2017-04-29 DIAGNOSIS — Z79899 Other long term (current) drug therapy: Secondary | ICD-10-CM | POA: Insufficient documentation

## 2017-04-29 DIAGNOSIS — N3001 Acute cystitis with hematuria: Secondary | ICD-10-CM | POA: Diagnosis not present

## 2017-04-29 DIAGNOSIS — R1084 Generalized abdominal pain: Secondary | ICD-10-CM

## 2017-04-29 DIAGNOSIS — R109 Unspecified abdominal pain: Secondary | ICD-10-CM | POA: Diagnosis present

## 2017-04-29 DIAGNOSIS — K529 Noninfective gastroenteritis and colitis, unspecified: Secondary | ICD-10-CM

## 2017-04-29 LAB — CBC
HCT: 37 % (ref 36.0–46.0)
HEMOGLOBIN: 12.3 g/dL (ref 12.0–15.0)
MCH: 29.4 pg (ref 26.0–34.0)
MCHC: 33.2 g/dL (ref 30.0–36.0)
MCV: 88.3 fL (ref 78.0–100.0)
Platelets: 393 10*3/uL (ref 150–400)
RBC: 4.19 MIL/uL (ref 3.87–5.11)
RDW: 14.3 % (ref 11.5–15.5)
WBC: 7.5 10*3/uL (ref 4.0–10.5)

## 2017-04-29 LAB — URINALYSIS, ROUTINE W REFLEX MICROSCOPIC
Bilirubin Urine: NEGATIVE
Glucose, UA: NEGATIVE mg/dL
Ketones, ur: 5 mg/dL — AB
Nitrite: POSITIVE — AB
PROTEIN: NEGATIVE mg/dL
Specific Gravity, Urine: 1.015 (ref 1.005–1.030)
pH: 5 (ref 5.0–8.0)

## 2017-04-29 LAB — LIPASE, BLOOD: Lipase: 19 U/L (ref 11–51)

## 2017-04-29 LAB — COMPREHENSIVE METABOLIC PANEL
ALK PHOS: 93 U/L (ref 38–126)
ALT: 11 U/L — AB (ref 14–54)
ANION GAP: 7 (ref 5–15)
AST: 16 U/L (ref 15–41)
Albumin: 3.7 g/dL (ref 3.5–5.0)
BUN: 8 mg/dL (ref 6–20)
CALCIUM: 9.2 mg/dL (ref 8.9–10.3)
CO2: 27 mmol/L (ref 22–32)
Chloride: 100 mmol/L — ABNORMAL LOW (ref 101–111)
Creatinine, Ser: 0.77 mg/dL (ref 0.44–1.00)
GFR calc Af Amer: >60 mL/min (ref >60)
GFR calc non Af Amer: >60 mL/min (ref >60)
GLUCOSE: 119 mg/dL — AB (ref 65–99)
Potassium: 3.6 mmol/L (ref 3.5–5.1)
SODIUM: 134 mmol/L — AB (ref 135–145)
Total Bilirubin: 0.4 mg/dL (ref 0.3–1.2)
Total Protein: 8.2 g/dL — ABNORMAL HIGH (ref 6.5–8.1)

## 2017-04-29 LAB — I-STAT BETA HCG BLOOD, ED (MC, WL, AP ONLY)

## 2017-04-29 LAB — WET PREP, GENITAL
Sperm: NONE SEEN
TRICH WET PREP: NONE SEEN
YEAST WET PREP: NONE SEEN

## 2017-04-29 MED ORDER — CEFTRIAXONE SODIUM 250 MG IJ SOLR
250.0000 mg | Freq: Once | INTRAMUSCULAR | Status: AC
  Administered 2017-04-29: 250 mg via INTRAMUSCULAR
  Filled 2017-04-29: qty 250

## 2017-04-29 MED ORDER — LIDOCAINE HCL (PF) 2 % IJ SOLN
INTRAMUSCULAR | Status: AC
  Administered 2017-04-29: 2 mL
  Filled 2017-04-29: qty 10

## 2017-04-29 MED ORDER — DICYCLOMINE HCL 20 MG PO TABS: 20.0000 mg | ORAL_TABLET | Freq: Two times a day (BID) | ORAL | 0 refills | Status: DC

## 2017-04-29 MED ORDER — CIPROFLOXACIN HCL 500 MG PO TABS: 500.0000 mg | ORAL_TABLET | Freq: Two times a day (BID) | ORAL | 0 refills | Status: DC

## 2017-04-29 MED ORDER — DICYCLOMINE HCL 10 MG/ML IM SOLN
20.0000 mg | Freq: Once | INTRAMUSCULAR | Status: AC
  Administered 2017-04-29: 20 mg via INTRAMUSCULAR
  Filled 2017-04-29: qty 2

## 2017-04-29 MED ORDER — METRONIDAZOLE 500 MG PO TABS: 500.0000 mg | ORAL_TABLET | Freq: Two times a day (BID) | ORAL | 0 refills | Status: DC

## 2017-04-29 NOTE — ED Notes (Signed)
Bed: WA07 Expected date:  Expected time:  Means of arrival:  Comments: 

## 2017-04-29 NOTE — ED Notes (Signed)
Took ginger tea and gas x yesterday without relief.

## 2017-04-29 NOTE — ED Notes (Signed)
Patient transported to CT. Delay in medication administration.

## 2017-04-29 NOTE — ED Notes (Signed)
Pt ambulatory and independent at discharge.  Verbalized understanding of discharge instructions 

## 2017-04-29 NOTE — ED Triage Notes (Signed)
Pt c/o abdominal pain x 2 days. Pt with nausea, lbm today, pt states it feels like she has much gas in abdomen, pt states she feels bloated.

## 2017-04-29 NOTE — Discharge Instructions (Signed)
Please see the information and instructions below regarding your visit.  Your diagnoses today include:  1. Acute cystitis with hematuria   2. Colitis   3. Diffuse abdominal pain    Your testing demonstrates that you have some inflammation in your colon, as well as urinary tract infection.  Your wet prep also shows bacterial vaginosis.  Many of the antibiotics for these conditions overlap, so we will treated with ciprofloxacin and Flagyl.  You were given a one-time dose of Rocephin in the ED.  I would like you to follow-up early next week with your primary care provider for a recheck of your symptoms.  Your symptoms may be caused by multiple of these pathologies.  Tests performed today include: See side panel of your discharge paperwork for testing performed today. Vital signs are listed at the bottom of these instructions.   Medications prescribed:    Take any prescribed medications only as prescribed, and any over the counter medications only as directed on the packaging.  Please take all of your antibiotics until finished.   You may develop abdominal discomfort or nausea from the antibiotic. If this occurs, you may take it with food. Some patients also get diarrhea with antibiotics. You may help offset this with probiotics which you can buy or get in yogurt. Do not eat or take the probiotics until 2 hours after your antibiotic. Some women develop vaginal yeast infections after antibiotics. If you develop unusual vaginal discharge after being on this medication, please see your primary care provider.   Some people develop allergies to antibiotics. Symptoms of antibiotic allergy can be mild and include a flat rash and itching. They can also be more serious and include:  ?Hives - Hives are raised, red patches of skin that are usually very itchy.  ?Lip or tongue swelling  ?Trouble swallowing or breathing  ?Blistering of the skin or mouth.  If you have any of these serious symptoms, please  seek emergency medical care immediately.  Please ensure that you do not drink alcohol for the entire course you are taking Flagyl.  This includes 48 hours after you finished the antibiotics.  It can make you nauseous and vomit if you drink alcohol while taking this antibiotic.  I also prescribed Bentyl, an antispasmodic agent for your belly pain.  You were given 1 dose of this in the ED.  Home care instructions:  Please follow any educational materials contained in this packet.   Follow-up instructions: Please follow-up with your primary care provider early next week for further evaluation of your symptoms if they are not completely improved.   Return instructions:  Please return to the Emergency Department if you experience worsening symptoms.  Please return to the emergency department if you have any worsening belly pain, belly pain that is focal to one specific area of your abdomen, fever or chills with your symptoms, vomiting blood, or having large amounts of blood in your stool, or nausea or vomiting that prevents you from keeping nutrition down. Please return if you have any other emergent concerns.  Additional Information:   Your vital signs today were: BP 121/70 (BP Location: Left Arm)    Pulse 94    Temp 98.6 F (37 C) (Oral)    Resp 16    LMP 04/19/2017 (Exact Date)    SpO2 99%  If your blood pressure (BP) was elevated on multiple readings during this visit above 130 for the top number or above 80 for the bottom number, please  have this repeated by your primary care provider within one month. --------------  Thank you for allowing [place] to participate in your care today.

## 2017-04-29 NOTE — ED Provider Notes (Signed)
[facility]-EMERGENCY DEPT Provider Note   CSN: [id number] Arrival date & time: 04/29/17  1043     History   Chief Complaint Chief Complaint  Patient presents with  . Abdominal Pain  . Nausea    HPI Cheyenne Lyons is a 37 y.o. female.  HPI   Patient is a 37 year old female with abdominal surgical history significant for hysteroscopy and 3 C-sections and a history of hypertension and hyperlipidemia presenting for diffuse abdominal pain and bloating for the past 3-4 days.  Patient reports that over the past 2 days, pain has been more constant, however today it is coming more colicky nature.  Patient reports that she is feeling pain in her flanks, however it is more focused on to the right side as well as periumbilical.  No fever or chills. Patient denies dysuria or urgency.  Patient denies vaginal bleeding.  Last menstrual period 04-12-2017.  Patient reports she is sexually active with one female partner, her husband.  Patient denies any unusual vaginal discharge.  Patient reports she was nauseous yesterday and vomited once after forcing herself to vomit, however she has been maintaining p.o. intake with liquid and solid.  Last bowel movement yesterday and normal for patient.  No hematemesis, melena, or hematochezia. Patient took ginger tea and Gas-X without relief.  Past Medical History:  Diagnosis Date  . Anemia   . GERD (gastroesophageal reflux disease)    with pregnancy  . Pneumonia   . Pulmonary edema    after delivery on last child  . Tendonitis    right thumb    Patient Active Problem List   Diagnosis Date Noted  . Dizziness 09/08/2014  . Morbid obesity (HCC) 05/10/2014  . Endometriosis 05/10/2014    Past Surgical History:  Procedure Laterality Date  . CESAREAN SECTION     x2  . CESAREAN SECTION WITH BILATERAL TUBAL LIGATION Bilateral 06/11/2013   Procedure: CESAREAN SECTION WITH BILATERAL TUBAL LIGATION;  Surgeon: Loney Laurence, MD;  Location:  [facility];  Service: Obstetrics;  Laterality: Bilateral;  . CLOSED REDUCTION FINGER FRACTURE    . HYSTEROSCOPY WITH NOVASURE N/A 01/06/2015   Procedure: HYSTEROSCOPY WITH NOVASURE;  Surgeon: Carrington Clamp, MD;  Location: [facility];  Service: Gynecology;  Laterality: N/A;  . MASS EXCISION N/A 01/06/2015   Procedure: EXCISION OF INCISION ABDONIMAL NODULE;  Surgeon: Carrington Clamp, MD;  Location: [facility];  Service: Gynecology;  Laterality: N/A;    OB History    Gravida Para Term Preterm AB Living   4 3 1   1 3    SAB TAB Ectopic Multiple Live Births   1       1       Home Medications    Prior to Admission medications   Medication Sig Start Date End Date Taking? Authorizing Provider  acetaminophen (TYLENOL) 500 MG tablet Take 500 mg by mouth every 6 (six) hours as needed for mild pain.    [provider]  ibuprofen (ADVIL,MOTRIN) 200 MG tablet Take 400 mg by mouth every 6 (six) hours as needed for moderate pain.    [provider]  lisinopril-hydrochlorothiazide (PRINZIDE,ZESTORETIC) 10-12.5 MG tablet TAKE 1 TABLET BY MOUTH DAILY **OVERDUE FOR YEARLY PHYSICAL W/LABS. MUST SEE MD FOR REFILLS** 02/21/16   Myrlene Broker, MD  meclizine (ANTIVERT) 25 MG tablet Take 1 tablet (25 mg total) by mouth 3 (three) times daily as needed for dizziness. Patient not taking: Reported on 12/26/2014 09/06/14   Linna Hoff, MD  oxyCODONE-acetaminophen (ROXICET) 5-325 MG per tablet Take 1 tablet by mouth every 4 (four) hours as needed for severe pain. 01/06/15   Carrington Clamp, MD  promethazine (PHENERGAN) 25 MG tablet Take 1 tablet (25 mg total) by mouth every 6 (six) hours as needed for nausea or vomiting. Patient not taking: Reported on 12/26/2014 09/06/14   Linna Hoff, MD  traMADol (ULTRAM) 50 MG tablet Take 50 mg by mouth every 6 (six) hours as needed.    [provider]    Family History Family History  Problem Relation Age of Onset  . Hyperlipidemia Mother   .  Hypertension Mother   . Cancer Maternal Aunt        ovarian  . Cancer Maternal Uncle        throat  . Hypertension Maternal Aunt   . Hypertension Maternal Grandmother   . Hypertension Paternal Grandmother   . Heart attack Maternal Uncle 50  . Stroke Neg Hx     Social History Social History   Tobacco Use  . Smoking status: Former Smoker    Packs/day: 0.25    Years: 17.00    Pack years: 4.25    Last attempt to quit: 06/09/2012    Years since quitting: 4.8  . Smokeless tobacco: Never Used  Substance Use Topics  . Alcohol use: No  . Drug use: No     Allergies   Patient has no known allergies.   Review of Systems Review of Systems  Constitutional: Negative for chills and fever.  HENT: Negative for congestion and rhinorrhea.   Eyes: Negative for visual disturbance.  Respiratory: Negative for cough.   Cardiovascular: Negative for chest pain.  Gastrointestinal: Positive for abdominal distention, abdominal pain, nausea and vomiting. Negative for blood in stool, constipation and diarrhea.  Genitourinary: Positive for flank pain. Negative for dysuria, pelvic pain, urgency, vaginal bleeding, vaginal discharge and vaginal pain.  Musculoskeletal: Negative for myalgias, neck pain and neck stiffness.  Neurological: Negative for headaches.     Physical Exam Updated Vital Signs BP 129/82 (BP Location: Left Arm)   Pulse 91   Temp 98.6 F (37 C) (Oral)   Resp 18   LMP 04/19/2017 (Exact Date)   SpO2 99%   Physical Exam  Constitutional: She appears well-developed and well-nourished. No distress.  HENT:  Head: Normocephalic and atraumatic.  Mouth/Throat: Oropharynx is clear and moist.  Eyes: Conjunctivae and EOM are normal. Pupils are equal, round, and reactive to light.  Neck: Normal range of motion. Neck supple.  Cardiovascular: Normal rate, regular rhythm, S1 normal and S2 normal.  No murmur heard. Pulmonary/Chest: Effort normal and breath sounds normal. She has no  wheezes. She has no rales.  Abdominal: Soft. Bowel sounds are normal. She exhibits no distension. There is tenderness. There is no guarding.  Patient is diffusely uncomfortable across the abdomen, however it is most prominent in the right lateral abdomen and periumbilical region.  Patient does also have CVA tenderness on the left.  Genitourinary:  Genitourinary Comments: Exam performed with nurse chaperone, Baxter Hire, present.  There are no external lesions of the vagina or perineum.  There is a small amount of milky white discharge in the vaginal vault.  No lesions of the vaginal wall.  Cervix without friability or erythema.  No cervical motion tenderness.  No adnexal or significant suprapubic tenderness.  Musculoskeletal: Normal range of motion. She exhibits no edema or deformity.  Lymphadenopathy:    She has no cervical adenopathy.  Neurological: She is  alert.  Patient was extremities symmetrically and with good coordination. Cranial nerves grossly intact.   Skin: Skin is warm and dry. No rash noted. No erythema.  Psychiatric: She has a normal mood and affect. Her behavior is normal. Judgment and thought content normal.  Nursing note and vitals reviewed.    ED Treatments / Results  Labs (all labs ordered are listed, but only abnormal results are displayed) Labs Reviewed  COMPREHENSIVE METABOLIC PANEL - Abnormal; Notable for the following components:      Result Value   Sodium 134 (*)    Chloride 100 (*)    Glucose, Bld 119 (*)    Total Protein 8.2 (*)    ALT 11 (*)    All other components within normal limits  URINALYSIS, ROUTINE W REFLEX MICROSCOPIC - Abnormal; Notable for the following components:   APPearance HAZY (*)    Hgb urine dipstick SMALL (*)    Ketones, ur 5 (*)    Nitrite POSITIVE (*)    Leukocytes, UA TRACE (*)    Bacteria, UA MANY (*)    Squamous Epithelial / LPF 0-5 (*)    All other components within normal limits  WET PREP, GENITAL  LIPASE, BLOOD  CBC  I-STAT  BETA HCG BLOOD, ED ([facility], [facility], [facility] ONLY)  GC/CHLAMYDIA PROBE AMP ([facility]) NOT AT [facility]    EKG  EKG Interpretation None       Radiology No results found.  Procedures Procedures (including critical care time)  Medications Ordered in ED Medications  dicyclomine (BENTYL) injection 20 mg (not administered)     Initial Impression / Assessment and Plan / ED Course  I have reviewed the triage vital signs and the nursing notes.  Pertinent labs & imaging results that were available during my care of the patient were reviewed by me and considered in my medical decision making (see chart for details).     Final Clinical Impressions(s) / ED Diagnoses   Final diagnoses:  Colitis  Acute cystitis with hematuria  Diffuse abdominal pain   Patient is nontoxic-appearing, afebrile, and in no acute distress on examination.  Patient is diffusely comfortable across the abdomen, however with the urine demonstrating signs of infection as well as some flank pain and some focal tenderness in the lower abdomen, will obtain CT renal stone study which also evaluate for inflammatory pathologies in the abdomen.  Will obtain GC chlamydia and wet prep as well as perform pelvic exam.  3:30 PM Discussed case with Dr. Arby BarretteMarcy Pfeiffer.  CT scan demonstrating no evidence of nephrolithiasis or pyelonephritis.  There is possible colitis of the colon of the hepatic flexure.  Given findings of possible colitis and reactive lymph nodes, will treat with Cipro Flagyl.  Patient given 1 dose of IM Rocephin in the emergency department as well.  Awaiting wet prep results.  Patient tolerated pelvic exam well.  There is no cervical motion tenderness.  No significant adnexal or suprapubic tenderness.  Patient with bacterial vaginosis.  No significant adnexal tenderness or cervical motion tenderness.  Will send GC/Chlamydia pending but wet prep not suggestive of these pathologies.  Will discharge home on Cipro/Flagyl.  Pain  improved with Bentyl.  Patient to be discharged with outpatient prescription of Bentyl.  I recommended patient follow-up with her primary care provider tomorrow morning to reassess after initiation of treatments for colitis and urinary tract infection.  Return precautions given for any focal abdominal pain, intractable nausea or vomiting, fever, chills, or blood in stool.  Patient is in understanding and agrees with the plan of care.  ED Discharge Orders    None       Delia Chimes 04/30/17 0259    Arby Barrette, MD 05/08/17 1530

## 2017-04-30 LAB — GC/CHLAMYDIA PROBE AMP (~~LOC~~) NOT AT ARMC
Chlamydia: NEGATIVE
Neisseria Gonorrhea: NEGATIVE

## 2017-05-01 LAB — URINE CULTURE: Culture: >=100000 — AB

## 2017-05-02 ENCOUNTER — Telehealth: Payer: Self-pay | Admitting: Emergency Medicine

## 2017-05-02 NOTE — Telephone Encounter (Signed)
Post ED Visit - Positive Culture Follow-up  Culture report reviewed by antimicrobial stewardship pharmacist:  []  Enzo BiNathan Batchelder, Pharm.D. []  Celedonio MiyamotoJeremy Frens, Pharm.D., BCPS AQ-ID []  Garvin FilaMike Maccia, Pharm.D., BCPS []  Georgina PillionElizabeth Martin, Pharm.D., BCPS []  TaylorMinh Pham, [address] BoulevardPharm.D., BCPS, AAHIVP []  Estella HuskMichelle Turner, Pharm.D., BCPS, AAHIVP []  Lysle Pearlachel Rumbarger, PharmD, BCPS []  Blake DivineShannon Parkey, PharmD []  Pollyann SamplesAndy Johnston, PharmD, BCPS  Positive urine culture Treated with ciprofloxacin and metronidazole, organism sensitive to the same and no further patient follow-up is required at this time.  Berle MullMiller, Jazline Cumbee 05/02/2017, 3:14 PM

## 2017-06-11 ENCOUNTER — Other Ambulatory Visit: Payer: Self-pay | Admitting: Gastroenterology

## 2017-06-13 ENCOUNTER — Other Ambulatory Visit: Payer: Self-pay

## 2017-06-13 ENCOUNTER — Encounter (HOSPITAL_COMMUNITY): Payer: Self-pay | Admitting: Emergency Medicine

## 2017-06-18 NOTE — H&P (Signed)
History of Present Illness  General:  36/female was referred for colitis. She was seen at her PCP on 05/23/17 with diffuse abdominal pain, CRP was elevated at 33.4, tTG IgA and endomysial AB were normal, Hb 11.8, WBC 5.7, Plt 351, MCv 88.9, Ct showed colitis at hepatic flexure and she was treated wit augmentin.She also had small mesenteric LNs likely reactive in right mid abdomen. Patient states that since end of January she hasmid and peri- umbilical abdominal pain described as cramping associated with bloody bowel movement. Patient has weight fluctuation of around 20 pounds in the same period and denies loss of appetite, early satiety but complains of bloating. Bowel movements are variable from once a week to once in every 3 to 4 days. Stools are not hard, mostly formed, she may have mucous in stools, but has been noticing fresh blood in stool as well as on wiping. There is no family history of IBD or colon cancer. No prior colonoscopy. She denies acid reflux, regurgitation, difficulty swallowing or pain on swallowing.   Current Medications  Taking   Benadryl Allergy(DiphenhydrAMINE HCl) 25 MG Tablet 1 tablet as needed Orally every 8 hrs   Ibuprofen 800 MG Tablet 1 tablet Orally three times a day as needed, Notes: as needed   Discontinued   Azithromycin 250 MG Tablet 2 tablet on the first day, then 1 tablet daily for 4 days Orally Once a day    Past Medical History  Colitis.    Surgical History  c-section x3   finger surgery    Family History  Father: alive, unknown  Mother: alive, HTN, fibroids  1 sister(s) - healthy.   No Family History of Colon Cancer, Polyps, or Liver Disease/.   Social History  General:  Tobacco use  cigarettes: Former smoker Quit in year 2014 Tobacco history last updated 06/11/2017 Alcohol: no.  Caffeine: yes.  Recreational drug use: never.  Exercise: nothing structured, intermittent.  Marital Status: Married.  Children: 1, 1, Boys, girls.     Allergies  N.K.D.A.   Hospitalization/Major Diagnostic Procedure  Not in the past year 05/2017    Review of Systems  CONSTITUTIONAL:  Chills No. Fatigue No. Fever No. Insomnia No. Night sweats No. Weight change No.  HEENT:  Change in vision No. Double vision No . Hoarseness YES. Nose bleeds No. sore throat No. Sinus Problems No. Glaucoma No.  CARDIOLOGY:  ByPass Surgery No. Poor Circulation No. Artificial Heart Valves No. High blood pressure No. History of Heart attack No. Irregular heart beat No. Known coronary artery disease No. Pacemaker/Defibrillator No.  RESPIRATORY:  Shortness of breath No. Cough No. Excessive Sputum No. Using Oxygen No. Asthma No. Bronchitis No. Pneumonia No. Sleep Apnea No.  UROLOGY:  Interstitials Cystitis No. Incontinence No. Blood in urine YES. Difficulty urinating No. Kidney disease No. Kidney stones No.  GASTROENTEROLOGY:  Abdominal pain YES. Acid reflux No. Black stools No. Bloating/belching YES. Change in bowel habits YES. Constipation YES. Dark tarry stools No. Diarrhea No. Difficulty swallowing No. Heartburn No. Incontinence of stool No. Indigestion: No. Lactose intolerance No. Nausea No. Rectal bleeding YES. Vomiting No. Hepatitis/yellow jaundice No. History of Ulcers No. Use of Pain Medication YES. Previous Colonoscopy No.  MUSCULOSKELETAL:  joint pain YES. Arthritis YES. Joint Replacement No.  NEUROLOGY:  Dizziness No. Fainting No. Headache No. Paralysis No. Seizures No. Strokes No. Weakness No. Alzheimer's No.  SKIN:  Rash YES. Bruises easily No.  ENDOCRINOLOGY:  Diabetes No. High cholesterol No. Thyroid disorder No.  HEMATOLOGY/LYMPH:  Abnormal bleeding No. Anemia No. Enlarged lymph nodes No. Past blood transfusion No. Swollen glands No. Blood Clots No. Using Blood Thinners No.  INFECTIOUS DISEASE:  HIV/AIDS No. Tuberculosis No . Hepatitis No. Sexually Transmitted Disease No. MRSA No.  GI PROCEDURE:  no Pacemaker/ AICD. no Artificial heart  valves. no MI/heart attack. no Abnormal heart rhythm. no Angina. no CVA. no Hypertension. no Hypotension. no Asthma, COPD. no Sleep apnea. no Seizure disorders. no Artificial joints. no Severe DJD. no Diabetes. no Significant headaches. no Vertigo. no Depression/anxiety. no Abnormal bleeding. no Kidney Disease. no Liver disease. no Chance of pregnancy. no Blood transfusion. no Method of Birth Control. no Birth control pills.  GU/GYN:  Pregnant Now No. Trying to conceive No. Use Birth Control No. Heavy Periods No.       Vital Signs  Wt 383.3, Wt change 49.5 lb, Ht 55, BMI 89.08, Temp 97.8, Pulse sitting 97, BP sitting 160/121, Repeat BP 140/92.   Examination  Gastroenterology:: GENERAL APPEARANCE: Well developed, morbidly obese, no active distress, pleasant, no acute distress.  EYES: Lids and conjunctiva normal. Sclera normal, pupils equal and reactive .  ORAL CAVITY: Lips, teeth and gums are normal. Pharynx, tongue, mucosa normal .  SCLERA: anicteric .  NECK Full ROM, trachea midline, no thyromegaly or masses .  CARDIOVASCULAR PMI LS border. Normal RRR w/o murmers or gallops. No peripheral edema .  RESPIRATORY Breath sounds normal. Respiration even and unlabored .  ABDOMEN No masses palpated. Liver and spleen not palpated, normal. Bowel sounds normal, Abdomen not distended .  EXTREMITIES: No edema, pulses intact .  NEURO: normal strength and reflexes, cranial nerves II-XII grossly intact, normal gait .  PSYCH: mood/affect normal .     Assessments   1. Colitis - K52.9 (Primary)   2. Blood in stool - K92.1   Treatment  1. Colitis  IMAGING: Colonoscopy    Whitfield,Dia 06/11/2017 10:00:28 AM > called and spoke with Kim-scheduled for 06/19/17 at [facility] at 11:45 am-prep instructions reviewed with pt.   Notes: ZOX:[id number]/ZHYQ likely ischemic. With proceed with a diagnostic colonoscopy and biopsies, as a BMI is a 50, will be done at the hospital as an outpatient. The risk  and benefits of the procedure were explained to the patient in details. She understands and verbalizes consent. She will be given written instructions, prescription for preparation and will be scheduled for the same.    2. Blood in stool  IMAGING: Colonoscopy    Whitfield,Dia 06/11/2017 10:00:28 AM > called and spoke with Kim-scheduled for 06/19/17 at [facility] at 11:45 am-prep instructions reviewed with pt.   Notes: DDx: Colitis, hemorrhoids, colonic lesion. Advised high-fiber diet, increased intake of fruits, vegetables, whole grains and to drink at least 60 ounces of water a day.

## 2017-06-19 ENCOUNTER — Ambulatory Visit (HOSPITAL_COMMUNITY)
Admission: RE | Admit: 2017-06-19 | Discharge: 2017-06-19 | Disposition: A | Discharge status: Home / Self Care | Payer: BLUE CROSS/BLUE SHIELD | Source: Ambulatory Visit | Attending: Gastroenterology | Admitting: Gastroenterology

## 2017-06-19 ENCOUNTER — Encounter (HOSPITAL_COMMUNITY)
Admission: RE | Disposition: A | Discharge status: Home / Self Care | Payer: Self-pay | Source: Ambulatory Visit | Attending: Gastroenterology

## 2017-06-19 ENCOUNTER — Ambulatory Visit (HOSPITAL_COMMUNITY): Payer: BLUE CROSS/BLUE SHIELD | Admitting: Anesthesiology

## 2017-06-19 ENCOUNTER — Other Ambulatory Visit: Payer: Self-pay

## 2017-06-19 ENCOUNTER — Encounter (HOSPITAL_COMMUNITY): Payer: Self-pay | Admitting: Emergency Medicine

## 2017-06-19 DIAGNOSIS — K625 Hemorrhage of anus and rectum: Secondary | ICD-10-CM | POA: Diagnosis present

## 2017-06-19 DIAGNOSIS — K648 Other hemorrhoids: Secondary | ICD-10-CM | POA: Insufficient documentation

## 2017-06-19 DIAGNOSIS — K5730 Diverticulosis of large intestine without perforation or abscess without bleeding: Secondary | ICD-10-CM | POA: Diagnosis not present

## 2017-06-19 DIAGNOSIS — K529 Noninfective gastroenteritis and colitis, unspecified: Secondary | ICD-10-CM | POA: Insufficient documentation

## 2017-06-19 DIAGNOSIS — Z87891 Personal history of nicotine dependence: Secondary | ICD-10-CM | POA: Diagnosis not present

## 2017-06-19 HISTORY — PX: COLONOSCOPY: SHX5424

## 2017-06-19 LAB — PREGNANCY, URINE: PREG TEST UR: NEGATIVE

## 2017-06-19 SURGERY — COLONOSCOPY
Anesthesia: Monitor Anesthesia Care

## 2017-06-19 MED ORDER — PROPOFOL 10 MG/ML IV BOLUS
INTRAVENOUS | Status: AC
  Filled 2017-06-19: qty 20

## 2017-06-19 MED ORDER — PROPOFOL 10 MG/ML IV BOLUS
INTRAVENOUS | Status: DC | PRN
  Administered 2017-06-19: 50 mg via INTRAVENOUS
  Administered 2017-06-19: 10 mg via INTRAVENOUS
  Administered 2017-06-19 (×2): 20 mg via INTRAVENOUS

## 2017-06-19 MED ORDER — SODIUM CHLORIDE 0.9 % IV SOLN: INTRAVENOUS | Status: DC

## 2017-06-19 MED ORDER — LIDOCAINE 2% (20 MG/ML) 5 ML SYRINGE
INTRAMUSCULAR | Status: DC | PRN
  Administered 2017-06-19: 50 mg via INTRAVENOUS

## 2017-06-19 MED ORDER — PROPOFOL 10 MG/ML IV BOLUS
INTRAVENOUS | Status: AC
  Filled 2017-06-19: qty 40

## 2017-06-19 MED ORDER — LACTATED RINGERS IV SOLN
INTRAVENOUS | Status: DC
  Administered 2017-06-19: 11:00:00 via INTRAVENOUS

## 2017-06-19 MED ORDER — ONDANSETRON HCL 4 MG/2ML IJ SOLN
INTRAMUSCULAR | Status: DC | PRN
  Administered 2017-06-19: 4 mg via INTRAVENOUS

## 2017-06-19 MED ORDER — PROPOFOL 500 MG/50ML IV EMUL
INTRAVENOUS | Status: DC | PRN
  Administered 2017-06-19: 125 ug/kg/min via INTRAVENOUS

## 2017-06-19 NOTE — Op Note (Signed)
[facility] Patient Name: Cheyenne Lyons Procedure Date: 06/19/2017 MRN: [id number] Attending MD: Kerin Salen , MD Date of Birth: Jul 16, 1980 CSN: [id number] Age: 37 Admit Type: Outpatient Procedure:                Colonoscopy Indications:              This is the patient's first colonoscopy, Rectal                            bleeding, Abnormal CT of the GI tract Providers:                Kerin Salen, MD, Dwain Sarna, RN, Arlee Muslim                            Tech., Technician, Verita Schneiders, Technician, Roni Bread, CRNA Referring MD:              Medicines:                Monitored Anesthesia Care Complications:            No immediate complications. Estimated Blood Loss:     Estimated blood loss: none. Procedure:                Pre-Anesthesia Assessment:                           - Prior to the procedure, a History and Physical                            was performed, and patient medications and                            allergies were reviewed. The patient's tolerance of                            previous anesthesia was also reviewed. The risks                            and benefits of the procedure and the sedation                            options and risks were discussed with the patient.                            All questions were answered, and informed consent                            was obtained. Prior Anticoagulants: The patient has                            taken no previous anticoagulant or antiplatelet  agents. ASA Grade Assessment: III - A patient with                            severe systemic disease. After reviewing the risks                            and benefits, the patient was deemed in                            satisfactory condition to undergo the procedure.                           After obtaining informed consent, the colonoscope                            was passed  under direct vision. Throughout the                            procedure, the patient's blood pressure, pulse, and                            oxygen saturations were monitored continuously. The                            EC-[id number] ([id number]) scope was introduced through                            the anus and advanced to the the terminal ileum.                            The colonoscopy was performed without difficulty.                            The patient tolerated the procedure well. The                            quality of the bowel preparation was good. The                            terminal ileum, ileocecal valve, appendiceal                            orifice, and rectum were photographed. Scope In: 12:01:16 PM Scope Out: 12:19:49 PM Scope Withdrawal Time: 0 hours 12 minutes 1 second  Total Procedure Duration: 0 hours 18 minutes 33 seconds  Findings:      The perianal and digital rectal examinations were normal.      A few small-mouthed diverticula were found in the sigmoid colon and       descending colon.      The terminal ileum appeared normal.      Non-bleeding internal hemorrhoids were found during retroflexion. The       hemorrhoids were mild and small.      The exam was otherwise without abnormality. Impression:               -  Diverticulosis in the sigmoid colon and in the                            descending colon.                           - The examined portion of the ileum was normal.                           - Non-bleeding internal hemorrhoids.                           - The examination was otherwise normal.                           - No specimens collected. Moderate Sedation:      Patient did not receive moderate sedation for this procedure, but       instead received monitored anesthesia care. Recommendation:           - Patient has a contact number available for                            emergencies. The signs and symptoms of potential                             delayed complications were discussed with the                            patient. Return to normal activities tomorrow.                            Written discharge instructions were provided to the                            patient.                           - High fiber diet.                           - Continue present medications.                           - Repeat colonoscopy at age 37 for screening                            purposes. Procedure Code(s):        --- Professional ---                           [id number], Colonoscopy, flexible; diagnostic, including                            collection of specimen(s) by brushing or washing,  when performed (separate procedure) Diagnosis Code(s):        --- Professional ---                           K64.8, Other hemorrhoids                           K62.5, Hemorrhage of anus and rectum                           K57.30, Diverticulosis of large intestine without                            perforation or abscess without bleeding                           R93.3, Abnormal findings on diagnostic imaging of                            other parts of digestive tract CPT copyright 2016 American [employer] Association. All rights reserved. The codes documented in this report are preliminary and upon coder review may  be revised to meet current compliance requirements. Kerin Salen, MD 06/19/2017 12:24:14 PM This report has been signed electronically. Number of Addenda: 0

## 2017-06-19 NOTE — Op Note (Signed)
Colonoscopy was performed as she had abnormal CAT scan in January 2019 which showed hepatic flexure colitis and rectal bleeding.  Findings: Normal perineal and digital rectal exam. Small scattered few diverticula in sigmoid and descending. Normal-appearing terminal ileum. Small nonbleeding internal hemorrhoids found on retroflexion. Otherwise normal colon.  Recommendations: Reassurance. High fiber diet. Avoid straining. Repeat colonoscopy at age 37 for average risk screening for colon cancer.  Kerin SalenArya Antwan Pandya, M.D.

## 2017-06-19 NOTE — Discharge Instructions (Signed)

## 2017-06-19 NOTE — Transfer of Care (Signed)
Immediate Anesthesia Transfer of Care Note  Patient: Cheyenne Lyons  Procedure(s) Performed: Procedure(s): COLONOSCOPY (N/A)  Patient Location: PACU  Anesthesia Type:MAC  Level of Consciousness: Patient easily awoken, sedated, comfortable, cooperative, following commands, responds to stimulation.   Airway & Oxygen Therapy: Patient spontaneously breathing, ventilating well, oxygen via simple oxygen mask.  Post-op Assessment: Report given to PACU RN, vital signs reviewed and stable, moving all extremities.   Post vital signs: Reviewed and stable.  Complications: No apparent anesthesia complications  Last Vitals:  Vitals:   06/19/17 1047 06/19/17 1225  BP: 105/66 (!) 110/58  Pulse: 83 79  Resp: (!) 26 12  Temp: 36.8 C 36.5 C  SpO2: 100% 100%    Last Pain:  Vitals:   06/19/17 1225  TempSrc: Oral         Complications: No apparent anesthesia complications

## 2017-06-19 NOTE — Anesthesia Preprocedure Evaluation (Signed)
Anesthesia Evaluation  Patient identified by MRN, date of birth, ID band Patient awake    Reviewed: Allergy & Precautions, H&P , NPO status , Patient's Chart, lab work & pertinent test results  Airway Mallampati: II   Neck ROM: full    Dental   Pulmonary former smoker,    breath sounds clear to auscultation       Cardiovascular negative cardio ROS   Rhythm:regular Rate:Normal     Neuro/Psych    GI/Hepatic GERD  ,GI bleed   Endo/Other  Morbid obesity  Renal/GU      Musculoskeletal   Abdominal   Peds  Hematology  (+) Blood dyscrasia, anemia ,   Anesthesia Other Findings   Reproductive/Obstetrics                             Anesthesia Physical Anesthesia Plan  ASA: II  Anesthesia Plan: MAC   Post-op Pain Management:    Induction: Intravenous  PONV Risk Score and Plan: 2 and Propofol infusion and Treatment may vary due to age or medical condition  Airway Management Planned: Simple Face Mask  Additional Equipment:   Intra-op Plan:   Post-operative Plan:   Informed Consent: I have reviewed the patients History and Physical, chart, labs and discussed the procedure including the risks, benefits and alternatives for the proposed anesthesia with the patient or authorized representative who has indicated his/her understanding and acceptance.     Plan Discussed with: CRNA and Anesthesiologist  Anesthesia Plan Comments:         Anesthesia Quick Evaluation

## 2017-06-19 NOTE — Anesthesia Procedure Notes (Signed)
Date/Time: 06/19/2017 11:52 AM Performed by: Thornell MuleStubblefield, Harryette Shuart G, CRNA Oxygen Delivery Method: Simple face mask

## 2017-06-19 NOTE — Brief Op Note (Signed)
06/19/2017  12:21 PM  PATIENT:  Cheyenne Lyons  37 y.o. female  PRE-OPERATIVE DIAGNOSIS:  Colitis, Blood in stool  POST-OPERATIVE DIAGNOSIS:  diverticulosis, internal hwemoirrhoids  PROCEDURE:  Procedure(s): COLONOSCOPY (N/A)  SURGEON:  Surgeon(s) and Role:    Ronnette Juniper, MD - Primary  PHYSICIAN ASSISTANT: None  ASSISTANTS: Cleda Daub, RN, Elspeth Cho, Tech, Charolette Child, Tech, Rosario Adie, CRNA  ANESTHESIA:   MAC  EBL:  none   BLOOD ADMINISTERED:none  DRAINS: none   LOCAL MEDICATIONS USED:  NONE  SPECIMEN:  No Specimen  DISPOSITION OF SPECIMEN:  N/A  COUNTS:  YES  TOURNIQUET:  * No tourniquets in log *  DICTATION: .Dragon Dictation  PLAN OF CARE: Discharge to home after PACU  PATIENT DISPOSITION:  PACU - hemodynamically stable.   Delay start of Pharmacological VTE agent (>24hrs) due to surgical blood loss or risk of bleeding: no

## 2017-06-19 NOTE — Interval H&P Note (Signed)
History and Physical Interval Note:  36/female with colitis on CT from 1/19 and rectal bleeding for a diagnostic colonoscopy. 06/19/2017 11:46 AM  Cheyenne Lyons  has presented today for colonoscopy, with the diagnosis of Colitis, Blood in stool  The various methods of treatment have been discussed with the patient and family. After consideration of risks, benefits and other options for treatment, the patient has consented to  Procedure(s): COLONOSCOPY (N/A) as a surgical intervention .  The patient's history has been reviewed, patient examined, no change in status, stable for surgery.  I have reviewed the patient's chart and labs.  Questions were answered to the patient's satisfaction.     Kerin SalenArya Elandra Powell

## 2017-06-20 ENCOUNTER — Encounter (HOSPITAL_COMMUNITY): Payer: Self-pay | Admitting: Gastroenterology

## 2017-06-20 NOTE — Anesthesia Postprocedure Evaluation (Signed)
Anesthesia Post Note  Patient: Cheyenne Lyons  Procedure(s) Performed: COLONOSCOPY (N/A )     Patient location during evaluation: PACU Anesthesia Type: MAC Level of consciousness: awake and alert Pain management: pain level controlled Vital Signs Assessment: post-procedure vital signs reviewed and stable Respiratory status: spontaneous breathing, nonlabored ventilation, respiratory function stable and patient connected to nasal cannula oxygen Cardiovascular status: stable and blood pressure returned to baseline Postop Assessment: no apparent nausea or vomiting Anesthetic complications: no    Last Vitals:  Vitals:   06/19/17 1230 06/19/17 1240  BP: 119/77 123/88  Pulse: 78 75  Resp: 13 18  Temp:    SpO2: 100% 100%    Last Pain:  Vitals:   06/19/17 1225  TempSrc: Oral                 [name]

## 2018-08-31 ENCOUNTER — Emergency Department (HOSPITAL_COMMUNITY)
Admission: EM | Admit: 2018-08-31 | Discharge: 2018-08-31 | Disposition: A | Discharge status: Home / Self Care | Payer: BLUE CROSS/BLUE SHIELD | Attending: Emergency Medicine | Admitting: Emergency Medicine

## 2018-08-31 ENCOUNTER — Other Ambulatory Visit: Payer: Self-pay

## 2018-08-31 ENCOUNTER — Encounter (HOSPITAL_COMMUNITY): Payer: Self-pay | Admitting: *Deleted

## 2018-08-31 ENCOUNTER — Emergency Department (HOSPITAL_BASED_OUTPATIENT_CLINIC_OR_DEPARTMENT_OTHER): Payer: BLUE CROSS/BLUE SHIELD

## 2018-08-31 DIAGNOSIS — Z79899 Other long term (current) drug therapy: Secondary | ICD-10-CM | POA: Insufficient documentation

## 2018-08-31 DIAGNOSIS — M7989 Other specified soft tissue disorders: Secondary | ICD-10-CM

## 2018-08-31 DIAGNOSIS — M79661 Pain in right lower leg: Secondary | ICD-10-CM | POA: Insufficient documentation

## 2018-08-31 DIAGNOSIS — Z87891 Personal history of nicotine dependence: Secondary | ICD-10-CM | POA: Diagnosis not present

## 2018-08-31 DIAGNOSIS — M79609 Pain in unspecified limb: Secondary | ICD-10-CM

## 2018-08-31 NOTE — ED Triage Notes (Signed)
Rt lower leg pain started yesterday, pain in back of leg. + Homans sign with assessment, skin warm and dry with good pulse.

## 2018-08-31 NOTE — ED Provider Notes (Signed)
[facility]-EMERGENCY DEPT Provider Note   CSN: [id number] Arrival date & time: 08/31/18  1136    History   Chief Complaint Chief Complaint  Patient presents with   Leg Pain    Rt    HPI Cheyenne Lyons is a 38 y.o. female.     HPI Patient presents with right lower leg pain.  Began yesterday.  States she stepped wrong on Monday but did not have pain until Friday.  Does not think they are related.  No chest pain.  No shortness of breath.  Pain is worse with walking.  No fevers.  No chills.  Pain is in the posterior lower leg. Past Medical History:  Diagnosis Date   Anemia    GERD (gastroesophageal reflux disease)    with pregnancy   Pneumonia    Pulmonary edema    after delivery on last child   Tendonitis    right thumb    Patient Active Problem List   Diagnosis Date Noted   Dizziness 09/08/2014   Morbid obesity (HCC) 05/10/2014   Endometriosis 05/10/2014    Past Surgical History:  Procedure Laterality Date   CESAREAN SECTION     x2   CESAREAN SECTION WITH BILATERAL TUBAL LIGATION Bilateral 06/11/2013   Procedure: CESAREAN SECTION WITH BILATERAL TUBAL LIGATION;  Surgeon: Loney LaurenceMichelle A Horvath, MD;  Location: [facility];  Service: Obstetrics;  Laterality: Bilateral;   CLOSED REDUCTION FINGER FRACTURE     COLONOSCOPY N/A 06/19/2017   Procedure: COLONOSCOPY;  Surgeon: Kerin SalenKarki, Arya, MD;  Location: [facility];  Service: Gastroenterology;  Laterality: N/A;   HYSTEROSCOPY WITH NOVASURE N/A 01/06/2015   Procedure: HYSTEROSCOPY WITH NOVASURE;  Surgeon: Carrington ClampMichelle Horvath, MD;  Location: [facility];  Service: Gynecology;  Laterality: N/A;   MASS EXCISION N/A 01/06/2015   Procedure: EXCISION OF INCISION ABDONIMAL NODULE;  Surgeon: Carrington ClampMichelle Horvath, MD;  Location: [facility];  Service: Gynecology;  Laterality: N/A;     OB History    Gravida  4   Para  3   Term  1   Preterm      AB  1   Living  3     SAB  1   TAB      Ectopic      Multiple     Live Births  1            Home Medications    Prior to Admission medications   Medication Sig Start Date End Date Taking? Authorizing Provider  Ginger, Zingiber officinalis, (GINGER ROOT PO) Take 1 capsule by mouth daily.   Yes [provider]  MELATONIN PO Take 1 tablet by mouth at bedtime as needed (sleep).   Yes [provider]  norethindrone (MICRONOR) 0.35 MG tablet Take 1 tablet by mouth daily. 08/07/18  Yes [provider]  phentermine 15 MG capsule Take 15 mg by mouth daily. 08/14/18  Yes [provider]  TURMERIC PO Take 1 tablet by mouth daily.   Yes [provider]    Family History Family History  Problem Relation Age of Onset   Hyperlipidemia Mother    Hypertension Mother    Cancer Maternal Aunt        ovarian   Cancer Maternal Uncle        throat   Hypertension Maternal Aunt    Hypertension Maternal Grandmother    Hypertension Paternal Grandmother    Heart attack Maternal Uncle 50   Stroke Neg Hx  Social History Social History   Tobacco Use   Smoking status: Former Smoker    Packs/day: 0.25    Years: 17.00    Pack years: 4.25    Last attempt to quit: 06/09/2012    Years since quitting: 6.2   Smokeless tobacco: Never Used  Substance Use Topics   Alcohol use: No   Drug use: No     Allergies   Patient has no known allergies.   Review of Systems Review of Systems  Constitutional: Negative for chills and fever.  Respiratory: Negative for shortness of breath.   Cardiovascular: Negative for chest pain.  Gastrointestinal: Negative for abdominal pain.  Genitourinary: Negative for flank pain.  Musculoskeletal: Negative for back pain.       Right calf pain.  Skin: Negative for rash.  Neurological: Negative for weakness.  Psychiatric/Behavioral: Negative for confusion.     Physical Exam Updated Vital Signs BP 117/84    Pulse 76    Temp 98.6 F (37 C) (Oral)    Resp 18    Ht   (1.651 m)    Wt (!) 172.4 kg    SpO2 100%    BMI 63.24 kg/m   Physical Exam Vitals signs and nursing note reviewed.  Constitutional:      Appearance: She is obese.  HENT:     Head: Normocephalic.  Cardiovascular:     Rate and Rhythm: Regular rhythm.  Pulmonary:     Breath sounds: No wheezing, rhonchi or rales.  Musculoskeletal:     Comments: Tenderness over right posterior lower calf and over Achilles.  No deformity of Achilles.  No ecchymosis.  No skin changes.  Able to flex and extend at the ankle.  Only mild pain with plantarflexion against resistance.  No tenderness over the knee.  No tenderness over the bony ankle.  Skin:    General: Skin is warm.     Capillary Refill: Capillary refill takes less than 2 seconds.  Neurological:     Mental Status: She is alert. Mental status is at baseline.      ED Treatments / Results  Labs (all labs ordered are listed, but only abnormal results are displayed) Labs Reviewed - No data to display  EKG None  Radiology Vas [place] Lower Extremity Venous (dvt) (only [facility])  Result Date: 08/31/2018  Lower Venous Study Indications: Pain, Swelling, and Right lower extremity.  Risk Factors: Morbidly obese 379.28 lbs. Performing Technologist: Toma Deiters RVS  Examination Guidelines: A complete evaluation includes B-mode imaging, spectral Doppler, color Doppler, and power Doppler as needed of all accessible portions of each vessel. Bilateral testing is considered an integral part of a complete examination. Limited examinations for reoccurring indications may be performed as noted.  +---------+---------------+---------+-----------+----------+-------------------+  RIGHT     Compressibility Phasicity Spontaneity Properties Summary              +---------+---------------+---------+-----------+----------+-------------------+  CFV       Full            Yes       Yes                                          +---------+---------------+---------+-----------+----------+-------------------+  SFJ  Unable to visualize                                                              well due to body                                                                 habitus              +---------+---------------+---------+-----------+----------+-------------------+  FV Prox   Full            Yes       Yes                                         +---------+---------------+---------+-----------+----------+-------------------+  FV Mid    Full            Yes       Yes                                         +---------+---------------+---------+-----------+----------+-------------------+  FV Distal Full            Yes       Yes                                         +---------+---------------+---------+-----------+----------+-------------------+  PFV                       Yes       Yes                    Difficult to                                                                     compess due to body                                                              habitus, flow                                                                    appears  normal       +---------+---------------+---------+-----------+----------+-------------------+  POP       Full            Yes       Yes                                         +---------+---------------+---------+-----------+----------+-------------------+  PTV       Full                                             Limited visalbity    +---------+---------------+---------+-----------+----------+-------------------+  PERO      Full                                             Limited visability   +---------+---------------+---------+-----------+----------+-------------------+   +----+---------------+---------+-----------+----------+-------+  LEFT Compressibility Phasicity Spontaneity Properties Summary   +----+---------------+---------+-----------+----------+-------+  CFV  Full            Yes       Yes                             +----+---------------+---------+-----------+----------+-------+  SFJ  Full                                                      +----+---------------+---------+-----------+----------+-------+     Summary: Right: There is no obvious evidence of deep vein thrombosis in the lower extremity. No cystic structure found in the popliteal fossa. Left: There is no evidence of a common femoral vein obstruction.  *See table(s) above for measurements and observations.    Preliminary     Procedures Procedures (including critical care time)  Medications Ordered in ED Medications - No data to display   Initial Impression / Assessment and Plan / ED Course  I have reviewed the triage vital signs and the nursing notes.  Pertinent labs & imaging results that were available during my care of the patient were reviewed by me and considered in my medical decision making (see chart for details).        Patient with right calf pain.  No injury.  Negative Doppler.  No bony tenderness.  Able to plantarflex.  Think less likely bony injury.  States she is able to walk on it.  Discharged to follow-up with PCP as needed.  Achilles injury considered but felt likely not severe injury.  Discharge home.  Final Clinical Impressions(s) / ED Diagnoses   Final diagnoses:  Right calf pain    ED Discharge Orders    None       Benjiman Core, MD 08/31/18 1541

## 2018-08-31 NOTE — ED Notes (Signed)
ED Provider at bedside. 

## 2018-08-31 NOTE — ED Notes (Signed)
Vascular bedside

## 2018-08-31 NOTE — Progress Notes (Signed)
Right lower extremity venous duplex completed. Results in Chart review CV Proc. [place] Th[facility] Barthelemy,RVS 08/31/2018, 3:20 pm

## 2019-02-12 ENCOUNTER — Other Ambulatory Visit: Payer: Self-pay | Admitting: Obstetrics and Gynecology

## 2019-02-12 DIAGNOSIS — N644 Mastodynia: Secondary | ICD-10-CM

## 2019-02-21 ENCOUNTER — Ambulatory Visit
Admission: RE | Admit: 2019-02-21 | Discharge: 2019-02-21 | Disposition: A | Discharge status: Home / Self Care | Payer: BC Managed Care – PPO | Source: Ambulatory Visit | Attending: Obstetrics and Gynecology | Admitting: Obstetrics and Gynecology

## 2019-02-21 ENCOUNTER — Ambulatory Visit
Admission: RE | Admit: 2019-02-21 | Discharge: 2019-02-21 | Disposition: A | Discharge status: Home / Self Care | Payer: BLUE CROSS/BLUE SHIELD | Source: Ambulatory Visit | Attending: Obstetrics and Gynecology | Admitting: Obstetrics and Gynecology

## 2019-02-21 ENCOUNTER — Other Ambulatory Visit: Payer: Self-pay | Admitting: Obstetrics and Gynecology

## 2019-02-21 ENCOUNTER — Other Ambulatory Visit: Payer: Self-pay

## 2019-02-21 DIAGNOSIS — N644 Mastodynia: Secondary | ICD-10-CM

## 2020-07-26 IMAGING — MG DIGITAL DIAGNOSTIC BILAT W/ TOMO
8 of 14 series · 8 of 40 positions shown · non-contrast
Comparison: None

ACR Breast Density Category a: The breast tissue is almost entirely
fatty.

CLINICAL DATA: 38-year-old patient presents for baseline mammogram
and evaluation recent area pain in the upper inner left breast.

EXAM:
DIGITAL DIAGNOSTIC BILATERAL MAMMOGRAM WITH CAD AND TOMO
ULTRASOUND LEFT BREAST

[L TAN synth-2D]
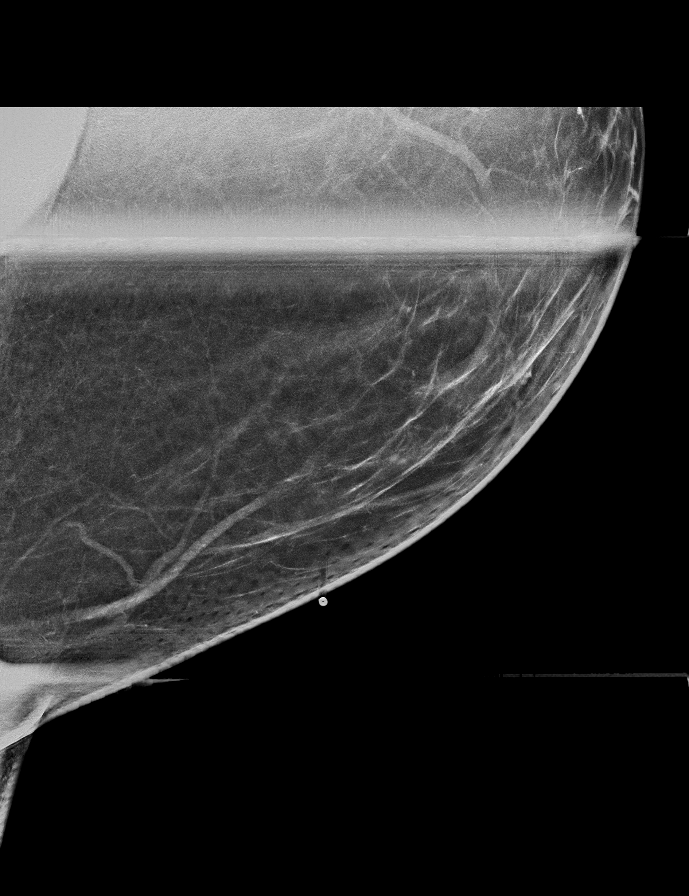

[L CC synth-2D]
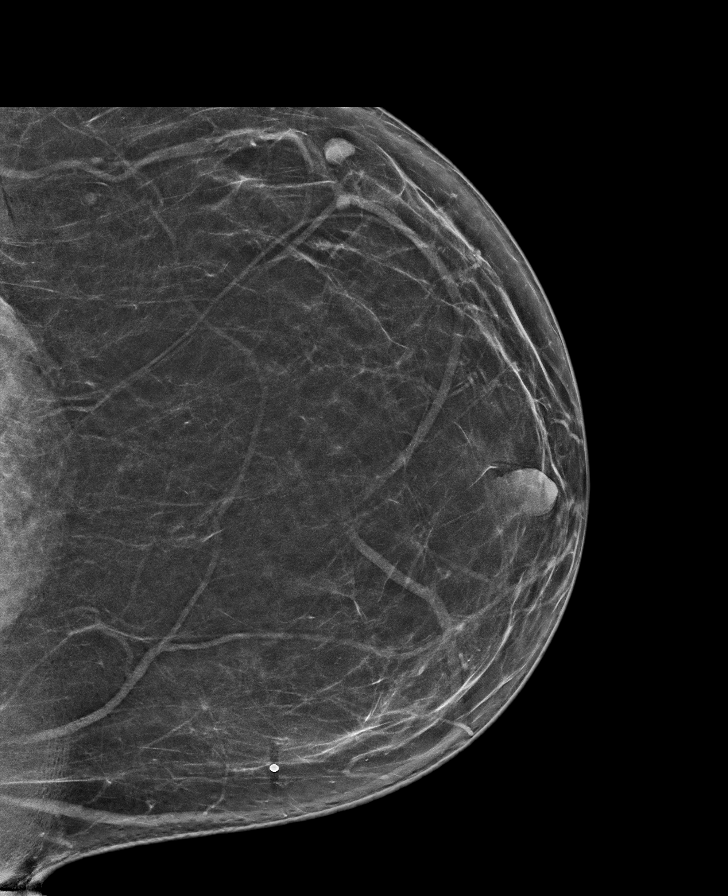

[L XCCL synth-2D]
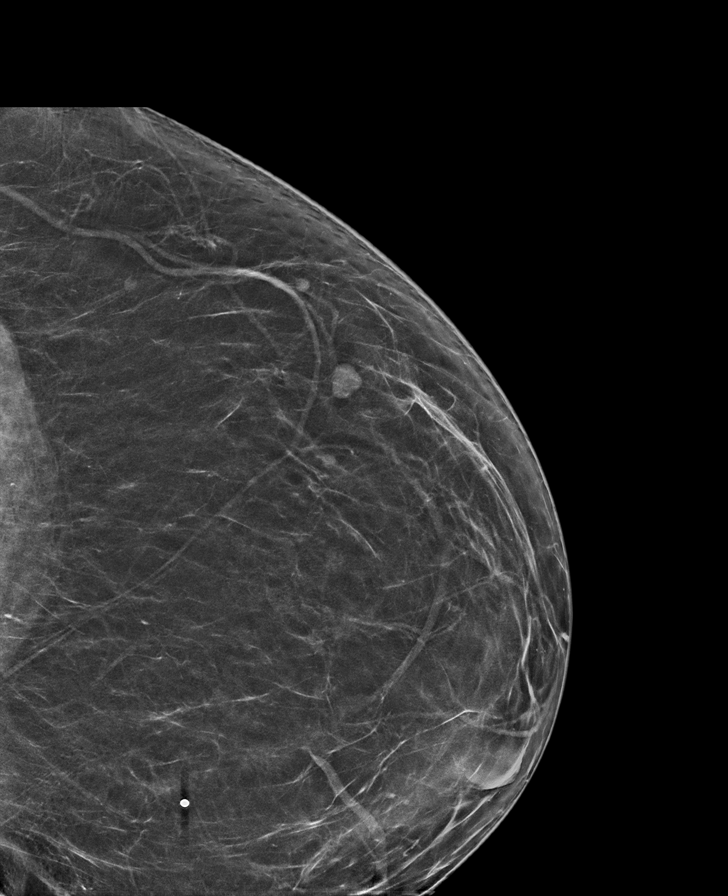

[L MLO synth-2D]
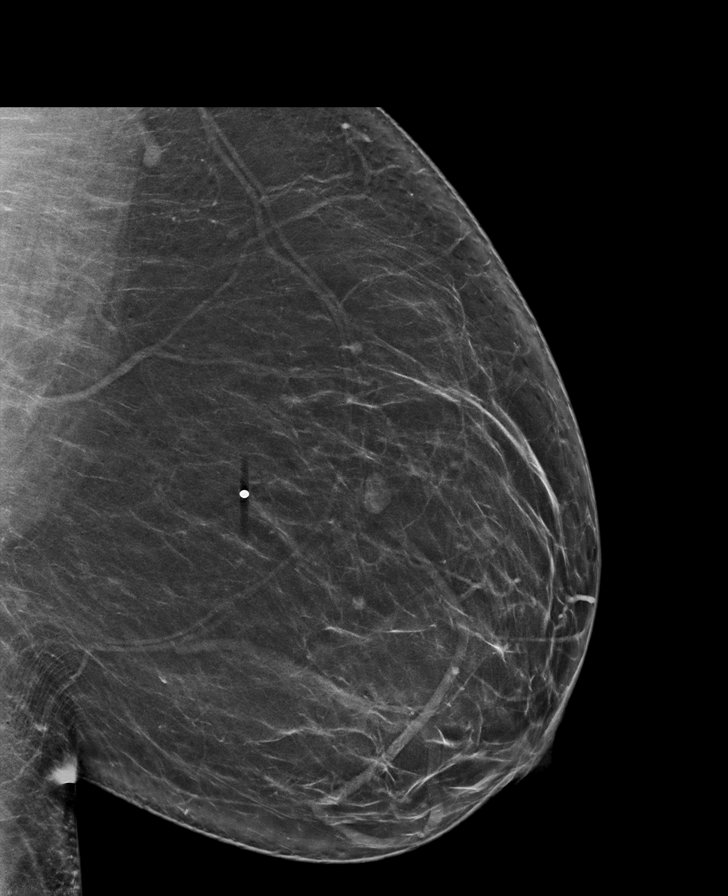

[R CC synth-2D]
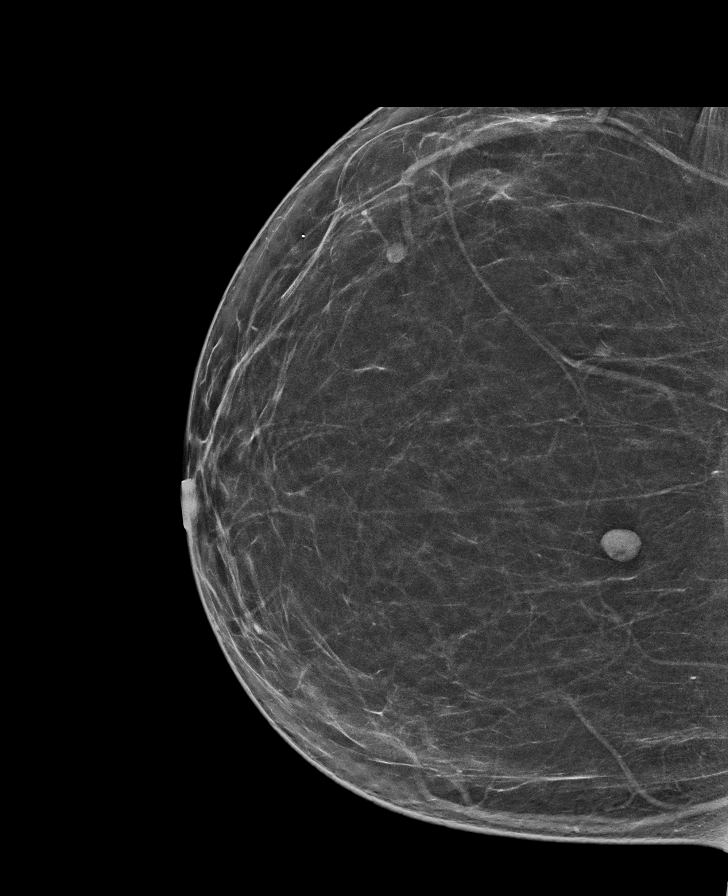

[R MLO synth-2D]
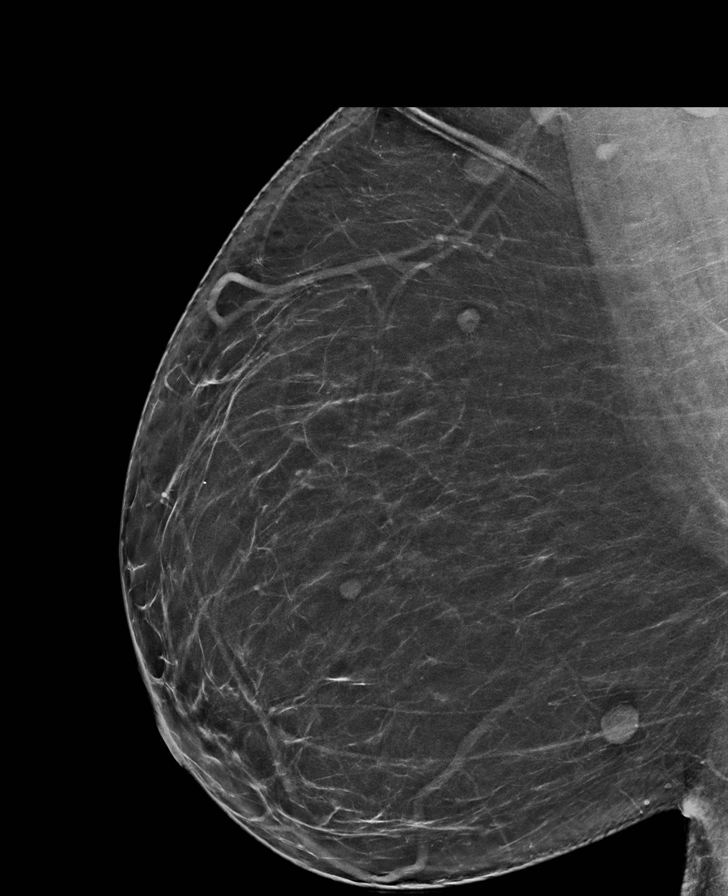

[R XCCL synth-2D]
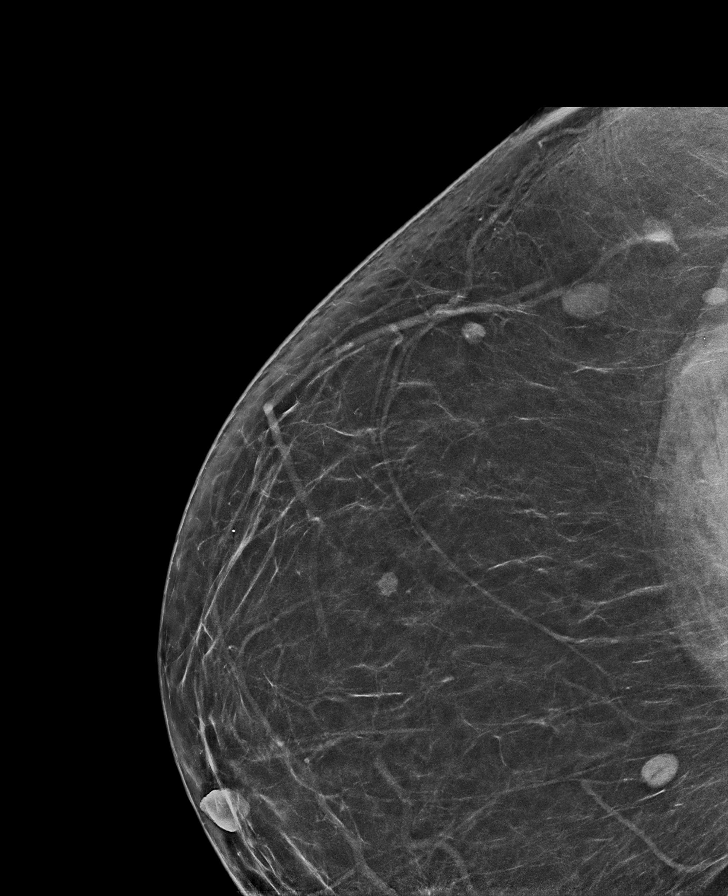

[L MLO tomo · tomo slice 47/92.0]
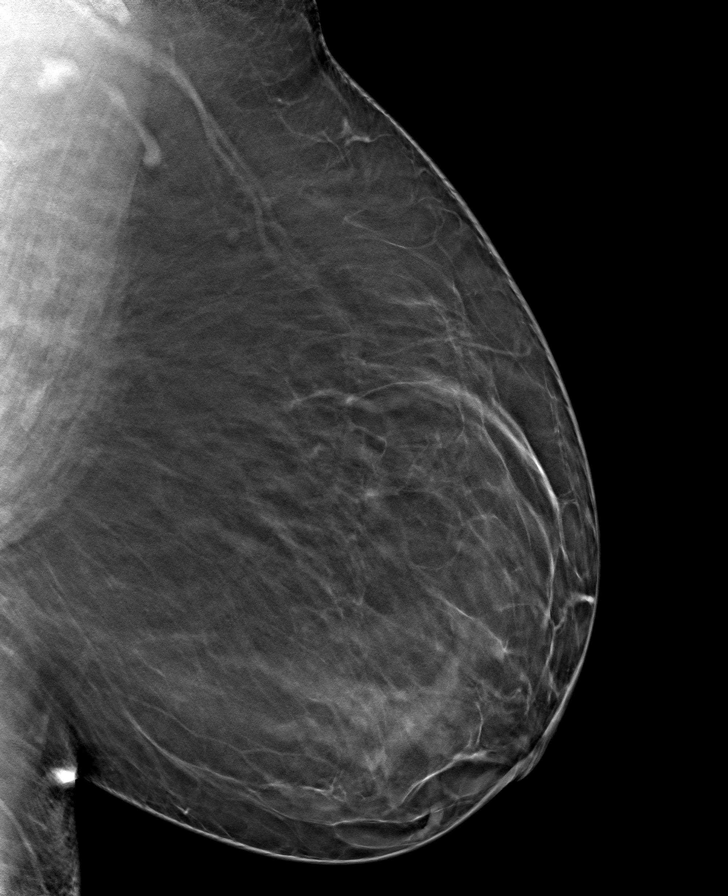

[8 of 40 positions shown; findings below may reference images not displayed]

FINDINGS: Bilateral circumscribed masses with fatty hila are consistent with
benign intramammary lymph nodes. The largest of these circumscribed
masses is in the inferior right breast and will be evaluated with
ultrasound, to confirm a fatty hilum.

No suspicious findings are seen in the region of patient tenderness
in the upper inner left breast. No suspicious microcalcification,
irregular mass, or architectural distortion is identified in either
breast.

Mammographic images were processed with CAD.

Targeted ultrasound is performed, showing normal fatty tissue in the
10 o'clock region of the left breast approximately 10 cm from the
nipple in the region of patient pain. No suspicious findings are
seen on ultrasound of the left breast in the region of patient
concern.

In the 4 o'clock position of the right breast 5 cm from the nipple
is a benign intramammary lymph node measuring a maximum diameter of
1.1 cm, and containing a normal fatty hilum.
IMPRESSION: No evidence of malignancy in either breast.

RECOMMENDATION:
Screening mammogram in one year.(Code:[id number])

I have discussed the findings and recommendations with the patient.
If applicable, a reminder letter will be sent to the patient
regarding the next appointment.

BI-RADS CATEGORY  2: Benign.

## 2021-05-18 ENCOUNTER — Other Ambulatory Visit: Payer: Self-pay | Admitting: Obstetrics and Gynecology

## 2021-05-18 DIAGNOSIS — Z01818 Encounter for other preprocedural examination: Secondary | ICD-10-CM

## 2021-07-01 NOTE — Patient Instructions (Signed)
DUE TO COVID-19 ONLY ONE VISITOR  (aged 42 and older)  IS ALLOWED TO COME WITH YOU AND STAY IN THE WAITING ROOM ONLY DURING PRE OP AND PROCEDURE.   ?**NO VISITORS ARE ALLOWED IN THE SHORT STAY AREA OR RECOVERY ROOM!!** ? ?IF YOU WILL BE ADMITTED INTO THE HOSPITAL YOU ARE ALLOWED ONLY TWO SUPPORT PEOPLE DURING VISITATION HOURS ONLY (7 AM -8PM)   ?The support person(s) must pass our screening, gel in and out, and wear a mask at all times, including in the patient?s room. ?Patients must also wear a mask when staff or their support person are in the room. ?Visitors GUEST BADGE MUST BE WORN VISIBLY  ?One adult visitor may remain with you overnight and MUST be in the room by 8 P.M. ?  ? ? COVID SWAB TESTING MUST BE COMPLETED ON:  07/13/21 ? ?  (*ARRIVE AT YOUR APPOINTMENT TIME STAFF IS NOT HERE BEFORE 8AM!!!*) ? ?  Site: [facility] [address] [place] ?Enter: Main Entrance have a seat in the waiting area to the right of main entrance (DO NOT CHECK IN AT ADMITTING!!!!!) ?Dial: [phone] to alert staff you have arrived ? ?You are not required to quarantine, however you are required to wear a well-fitted mask when you are out and around people not in your household.  ?Hand Hygiene often ?Do NOT share personal items ?Notify your provider if you are in close contact with someone who has COVID or you develop fever 100.4 or greater, new onset of sneezing, cough, sore throat, shortness of breath or body aches. ? ?[facility] Entrance [address], [address], must go inside of the hospital, NOT A DRIVE THRU!  (Must self quarantine after testing. Follow instructions on handout.) ?     ? Your procedure is scheduled on: 07/15/21 ? ? Report to [facility] Main Entrance ? ?  Report to admitting at: 11:30 AM ? ? Call this number if you have problems the morning of surgery [phone] ? ? Eat a light diet the day before surgery.  Examples including soups,  broths, toast, yogurt, mashed potatoes.  Things to avoid include carbonated beverages (fizzy beverages), raw fruits and raw vegetables, or beans.  ? ?If your bowels are filled with gas, your surgeon will have difficulty visualizing your pelvic organs which increases your surgical risks.  ?Do not eat food :After Midnight. ? ? After Midnight you may have the following liquids until : 10:30 AM  DAY OF SURGERY ? ?Water ?Black Coffee (sugar ok, NO MILK/CREAM OR CREAMERS)  ?Tea (sugar ok, NO MILK/CREAM OR CREAMERS) regular and decaf                             ?Plain Jell-O (NO RED)                                           ?Fruit ices (not with fruit pulp, NO RED)                                     ?Popsicles (NO RED)                                                                  ?  Juice: apple, WHITE grape, WHITE cranberry ?Sports drinks like Gatorade (NO RED) ?Clear broth(vegetable,chicken,beef) ?  ?Oral Hygiene is also important to reduce your risk of infection.                                    ?Remember - BRUSH YOUR TEETH THE MORNING OF SURGERY WITH YOUR REGULAR TOOTHPASTE ? ? Do NOT smoke after Midnight ? ? Take these medicines the morning of surgery with A SIP OF WATER: phenazopyridine as needed. ? ?DO NOT TAKE ANY ORAL DIABETIC MEDICATIONS DAY OF YOUR SURGERY ? ?Bring CPAP mask and tubing day of surgery. ?                  ?           You may not have any metal on your body including hair pins, jewelry, and body piercing ? ?           Do not wear make-up, lotions, powders, perfumes/cologne, or deodorant ? ?Do not wear nail polish including gel and S&S, artificial/acrylic nails, or any other type of covering on natural nails including finger and toenails. If you have artificial nails, gel coating, etc. that needs to be removed by a nail salon please have this removed prior to surgery or surgery may need to be canceled/ delayed if the surgeon/ anesthesia feels like they are unable to be safely monitored.  ? ?Do  not shave  48 hours prior to surgery.  ? ? Do not bring valuables to the hospital. [facility] IS NOT ?            RESPONSIBLE   FOR VALUABLES. ? ? Contacts, dentures or bridgework may not be worn into surgery. ? ? Bring small overnight bag day of surgery. ?  ? Patients discharged on the day of surgery will not be allowed to drive home.  Someone NEEDS to stay with you for the first 24 hours after anesthesia. ? ? Special Instructions: Bring a copy of your healthcare power of attorney and living will documents         the day of surgery if you haven't scanned them before. ? ?            Please read over the following fact sheets you were given: IF YOU HAVE QUESTIONS ABOUT YOUR PRE-OP INSTRUCTIONS PLEASE CALL [phone] ? ?   [facility] - Preparing for Surgery ?Before surgery, you can play an important role.  Because skin is not sterile, your skin needs to be as free of germs as possible.  You can reduce the number of germs on your skin by washing with CHG (chlorahexidine gluconate) soap before surgery.  CHG is an antiseptic cleaner which kills germs and bonds with the skin to continue killing germs even after washing. ?Please DO NOT use if you have an allergy to CHG or antibacterial soaps.  If your skin becomes reddened/irritated stop using the CHG and inform your nurse when you arrive at Short Stay. ?Do not shave (including legs and underarms) for at least 48 hours prior to the first CHG shower.  You may shave your face/neck. ?Please follow these instructions carefully: ? 1.  Shower with CHG Soap the night before surgery and the  morning of Surgery. ? 2.  If you choose to wash your hair, wash your hair first as usual with your  normal  shampoo. ? 3.  After you shampoo, rinse your hair and body thoroughly to remove the  shampoo.                           4.  Use CHG as you would any other liquid soap.  You can apply chg directly  to the skin and wash  ?                     Gently with a scrungie or clean  washcloth. ? 5.  Apply the CHG Soap to your body ONLY FROM THE NECK DOWN.   Do not use on face/ open      ?                     Wound or open sores. Avoid contact with eyes, ears mouth and genitals (private parts).  ?                     Engineering geologistWash face,  Genitals (private parts) with your normal soap. ?            6.  Wash thoroughly, paying special attention to the area where your surgery  will be performed. ? 7.  Thoroughly rinse your body with warm water from the neck down. ? 8.  DO NOT shower/wash with your normal soap after using and rinsing off  the CHG Soap. ?               9.  Pat yourself dry with a clean towel. ?           10.  Wear clean pajamas. ?           11.  Place clean sheets on your bed the night of your first shower and do not  sleep with pets. ?Day of Surgery : ?Do not apply any lotions/deodorants the morning of surgery.  Please wear clean clothes to the hospital/surgery center. ? ?FAILURE TO FOLLOW THESE INSTRUCTIONS MAY RESULT IN THE CANCELLATION OF YOUR SURGERY ?PATIENT SIGNATURE_________________________________ ? ?NURSE SIGNATURE__________________________________ ? ?________________________________________________________________________  ?

## 2021-07-04 ENCOUNTER — Encounter (HOSPITAL_COMMUNITY): Payer: Self-pay

## 2021-07-04 ENCOUNTER — Other Ambulatory Visit: Payer: Self-pay

## 2021-07-04 ENCOUNTER — Encounter (HOSPITAL_COMMUNITY)
Admission: RE | Admit: 2021-07-04 | Discharge: 2021-07-04 | Disposition: A | Discharge status: Home / Self Care | Payer: BC Managed Care – PPO | Source: Ambulatory Visit | Attending: Obstetrics and Gynecology | Admitting: Obstetrics and Gynecology

## 2021-07-04 VITALS — BP 148/83 | HR 78 | Temp 98.7°F | Resp 16 | Ht 65.0 in | Wt 370.0 lb

## 2021-07-04 DIAGNOSIS — Z20822 Contact with and (suspected) exposure to covid-19: Secondary | ICD-10-CM | POA: Diagnosis not present

## 2021-07-04 DIAGNOSIS — Z01818 Encounter for other preprocedural examination: Secondary | ICD-10-CM | POA: Diagnosis present

## 2021-07-04 DIAGNOSIS — R7303 Prediabetes: Secondary | ICD-10-CM | POA: Insufficient documentation

## 2021-07-04 HISTORY — DX: Prediabetes: R73.03

## 2021-07-04 HISTORY — DX: Essential (primary) hypertension: I10

## 2021-07-04 HISTORY — DX: Unspecified osteoarthritis, unspecified site: M19.90

## 2021-07-04 LAB — TYPE AND SCREEN
ABO/RH(D): O POS
Antibody Screen: NEGATIVE

## 2021-07-04 LAB — GLUCOSE, CAPILLARY: Glucose-Capillary: 76 mg/dL (ref 70–99)

## 2021-07-04 LAB — CBC
HCT: 36.9 % (ref 36.0–46.0)
Hemoglobin: 12.2 g/dL (ref 12.0–15.0)
MCH: 30.7 pg (ref 26.0–34.0)
MCHC: 33.1 g/dL (ref 30.0–36.0)
MCV: 92.7 fL (ref 80.0–100.0)
Platelets: 303 10*3/uL (ref 150–400)
RBC: 3.98 MIL/uL (ref 3.87–5.11)
RDW: 14.1 % (ref 11.5–15.5)
WBC: 5 10*3/uL (ref 4.0–10.5)
nRBC: 0 % (ref 0.0–0.2)

## 2021-07-04 NOTE — Progress Notes (Signed)
For Short Stay: ?COVID SWAB appointment date: 07/13/21 ?Date of COVID positive in last 90 days: N/A ?COVID Vaccine: Pfizer x 2 ?Bowel Prep reminder: ? ? ?For Anesthesia: ?PCP -  ?Cardiologist -  ? ?Chest x-ray -  ?EKG -  ?Stress Test -  ?ECHO -  ?Cardiac Cath -  ?Pacemaker/ICD device last checked: ?Pacemaker orders received: ?Device Rep notified: ? ?Spinal Cord Stimulator: ? ?Sleep Study - Yes ?CPAP - NO ? ?Fasting Blood Sugar -  ?Checks Blood Sugar _____ times a day ?Date and result of last Hgb A1c- ? ?Blood Thinner Instructions: ?Aspirin Instructions: ?Last Dose: ? ?Activity level: Can go up a flight of stairs and activities of daily living without stopping and without chest pain and/or shortness of breath ?  Able to exercise without chest pain and/or shortness of breath ?  Unable to go up a flight of stairs without chest pain and/or shortness of breath ?   ? ?Anesthesia review: Hx: HTN,Pre-DIA,PE,OSA(NO CPAP) ? ?Patient denies shortness of breath, fever, cough and chest pain at PAT appointment ? ? ?Patient verbalized understanding of instructions that were given to them at the PAT appointment. Patient was also instructed that they will need to review over the PAT instructions again at home before surgery.  ?

## 2021-07-05 LAB — HEMOGLOBIN A1C
Hgb A1c MFr Bld: 5.8 % — ABNORMAL HIGH (ref 4.8–5.6)
Mean Plasma Glucose: 120 mg/dL

## 2021-07-13 ENCOUNTER — Encounter (HOSPITAL_COMMUNITY): Payer: BC Managed Care – PPO

## 2021-07-14 NOTE — H&P (Signed)
41 y.o. [id number] complains of adenomyosis. ? ?Previously:"Pt. here for pre op visit; TLH/b-salpingectomy scheduled on 03/24; My Risk Genetic testing done last visit/tb// ? ?My Risk completely negative - no mutations or increased risk. ? ?Previously:"GYN [place] to check for fibroids and adeno prior to scheduling hysterectomy; Needs to watch Myriad video; Pyridium was sent in last visit for pelvic pressure, has taken it twice but wasn't sure when she should take it; UCx- 50-100k mixed/tb// ? ?Physician interpretation: 10x6x4; EM 7.57m. Possible adenomyosis. No fibroids. Normal ovaries, no ff./mah. ? ?Previously:"Ibuprofen before sex and with cramping is ok- d/w pt to call if she feels like sex is causing them and will add Macrobid. ?Normal uterus today but will check [place] fib[place] or [place]; even if none, may still qualify for hyst. Will do [place] year but hyst next year if so." ? ?Ucx was mixed- recheck today with good wiping. " ? ?Ucx was still mixed. Period" ? ?Past Medical History:  ?Diagnosis Date  ? Anemia   ? Arthritis   ? GERD (gastroesophageal reflux disease)   ? with pregnancy  ? Hypertension   ? Pneumonia   ? Pre-diabetes   ? Pulmonary edema   ? after delivery on last child  ? Tendonitis   ? right thumb  ? ?Past Surgical History:  ?Procedure Laterality Date  ? CESAREAN SECTION    ? x2  ? CESAREAN SECTION WITH BILATERAL TUBAL LIGATION Bilateral 06/11/2013  ? Procedure: CESAREAN SECTION WITH BILATERAL TUBAL LIGATION;  Surgeon: MDaria Pastures MD;  [facility];  Service: Obstetrics;  Laterality: Bilateral;  ? CLOSED REDUCTION FINGER FRACTURE    ? COLONOSCOPY N/A 06/19/2017  ? Procedure: COLONOSCOPY;  Surgeon: KRonnette Juniper MD;  [facility];  Service: Gastroenterology;  Laterality: N/A;  ? HYSTEROSCOPY WITH NOVASURE N/A 01/06/2015  ? Procedure: HYSTEROSCOPY WITH NOVASURE;  Surgeon: MBobbye Charleston MD;  [facility];  Service: Gynecology;  Laterality: N/A;  ? MASS EXCISION N/A 01/06/2015  ?  Procedure: EXCISION OF INCISION ABDONIMAL NODULE;  Surgeon: MBobbye Charleston MD;  [facility];  Service: Gynecology;  Laterality: N/A;  ?  ?Social History  ? ?Socioeconomic History  ? Marital status: Married  ?  Spouse name: Not on file  ? Number of children: Not on file  ? Years of education: Not on file  ? Highest education level: Not on file  ?Occupational History  ? Not on file  ?Tobacco Use  ? Smoking status: Former  ?  Packs/day: 0.25  ?  Years: 17.00  ?  Pack years: 4.25  ?  Types: Cigarettes  ?  Quit date: 06/09/2012  ?  Years since quitting: 9.1  ? Smokeless tobacco: Never  ?Vaping Use  ? Vaping Use: Never used  ?Substance and Sexual Activity  ? Alcohol use: No  ? Drug use: No  ? Sexual activity: Not on file  ?Other Topics Concern  ? Not on file  ?Social History Narrative  ? Not on file  ? ?Social Determinants of Health  ? ?Financial Resource Strain: Not on file  ?Food Insecurity: Not on file  ?Transportation Needs: Not on file  ?Physical Activity: Not on file  ?Stress: Not on file  ?Social Connections: Not on file  ?Intimate Partner Violence: Not on file  ? ? ?No current facility-administered medications on file prior to encounter.  ? ?Current Outpatient Medications on File Prior to Encounter  ?Medication Sig Dispense Refill  ? Aspirin-Caffeine (BC FAST PAIN RELIEF PO) Take 2  packets by mouth daily as needed (pain).    ? diphenhydrAMINE (BENADRYL) 25 MG tablet Take 50 mg by mouth every 6 (six) hours as needed for allergies.    ? ibuprofen (ADVIL) 800 MG tablet Take 800 mg by mouth every 8 (eight) hours as needed for moderate pain.    ? Menthol-Methyl Salicylate (SALONPAS PAIN RELIEF PATCH) PTCH Apply 2 patches topically daily as needed (pain).    ? phenazopyridine (PYRIDIUM) 100 MG tablet Take 100 mg by mouth every 8 (eight) hours as needed for pain.    ? ? ?No Known Allergies ? ?There were no vitals filed for this visit. ? ?Lungs: clear to ascultation ?Cor:  RRR ?Abdomen:  soft, nontender,  nondistended. ?Ex:  no cords, erythema ?Pelvic:   ?Vulva: no masses, no atrophy, no lesions ?Vagina: no tenderness, no erythema, no abnormal vaginal discharge, no vesicle(s) or ulcers, no cystocele, no rectocele ?Cervix: grossly normal, no discharge, no cervical motion tenderness ?Uterus: normal size (7), normal shape, midline, no uterine prolapse, non-tender ?Bladder/Urethra: normal meatus, no urethral discharge, no urethral mass, bladder non distended, Urethra well supported ?Adnexa/Parametria: no parametrial tenderness, no parametrial mass, no adnexal tenderness, no ovarian mass ? ? ?A:  Pt with pain with sex, probable adenomyosis. ? ?P: All risks, benefits and alternatives d/w patient and she desires to proceed.  Patient has undergone a ERAS protocol and will receive preop antibiotics and SCDs during the operation.   Pt to have extended recovery but will go home same day if eating, ambulating, voiding and pain control is good. ? ?Daria Pastures  ?

## 2021-07-14 NOTE — Anesthesia Preprocedure Evaluation (Addendum)
Anesthesia Evaluation  ?Patient identified by MRN, date of birth, ID band ?Patient awake ? ? ? ?Reviewed: ?Allergy & Precautions, NPO status , Patient's Chart, lab work & pertinent test results ? ?History of Anesthesia Complications ?Negative for: history of anesthetic complications ? ?Airway ?Mallampati: II ? ?TM Distance: >3 FB ?Neck ROM: Full ? ? ? Dental ?no notable dental hx. ? ?  ?Pulmonary ?former smoker,  ?  ?Pulmonary exam normal ? ? ? ? ? ? ? Cardiovascular ?hypertension, Normal cardiovascular exam ? ? ?  ?Neuro/Psych ?  ? GI/Hepatic ?GERD  Controlled,  ?Endo/Other  ? ? Renal/GU ?  ? ?  ?Musculoskeletal ? ?(+) Arthritis ,  ? Abdominal ?  ?Peds ? Hematology ?  ?Anesthesia Other Findings ? ? Reproductive/Obstetrics ? ?  ? ? ? ? ? ? ? ? ? ? ? ? ? ?  ?  ? ? ? ? ? ? ? ?Anesthesia Physical ?Anesthesia Plan ? ?ASA: 3 ? ?Anesthesia Plan: General  ? ?Post-op Pain Management: Tylenol PO (pre-op)*, Ketamine IV*, Lidocaine infusion* and Celebrex PO (pre-op)*  ? ?Induction: Intravenous ? ?PONV Risk Score and Plan: 3 and Treatment may vary due to age or medical condition, Ondansetron, Dexamethasone, Midazolam and Scopolamine patch - Pre-op ? ?Airway Management Planned: Oral ETT ? ?Additional Equipment: None ? ?Intra-op Plan:  ? ?Post-operative Plan: Extubation in OR ? ?Informed Consent: I have reviewed the patients History and Physical, chart, labs and discussed the procedure including the risks, benefits and alternatives for the proposed anesthesia with the patient or authorized representative who has indicated his/her understanding and acceptance.  ? ? ? ?Dental advisory given ? ?Plan Discussed with: CRNA ? ?Anesthesia Plan Comments:   ? ? ? ? ? ?Anesthesia Quick Evaluation ? ?

## 2021-07-15 ENCOUNTER — Ambulatory Visit (HOSPITAL_COMMUNITY): Payer: BC Managed Care – PPO | Admitting: Anesthesiology

## 2021-07-15 ENCOUNTER — Encounter (HOSPITAL_COMMUNITY)
Admission: RE | Disposition: A | Discharge status: Home / Self Care | Payer: Self-pay | Source: Home / Self Care | Attending: Obstetrics and Gynecology

## 2021-07-15 ENCOUNTER — Encounter (HOSPITAL_COMMUNITY): Payer: Self-pay | Admitting: Obstetrics and Gynecology

## 2021-07-15 ENCOUNTER — Ambulatory Visit (HOSPITAL_COMMUNITY)
Admission: RE | Admit: 2021-07-15 | Discharge: 2021-07-15 | Disposition: A | Discharge status: Home / Self Care | Payer: BC Managed Care – PPO | Attending: Obstetrics and Gynecology | Admitting: Obstetrics and Gynecology

## 2021-07-15 ENCOUNTER — Other Ambulatory Visit: Payer: Self-pay

## 2021-07-15 DIAGNOSIS — N72 Inflammatory disease of cervix uteri: Secondary | ICD-10-CM | POA: Insufficient documentation

## 2021-07-15 DIAGNOSIS — N736 Female pelvic peritoneal adhesions (postinfective): Secondary | ICD-10-CM | POA: Diagnosis not present

## 2021-07-15 DIAGNOSIS — N8000 Endometriosis of the uterus, unspecified: Secondary | ICD-10-CM | POA: Insufficient documentation

## 2021-07-15 DIAGNOSIS — Z01818 Encounter for other preprocedural examination: Secondary | ICD-10-CM

## 2021-07-15 DIAGNOSIS — Z87891 Personal history of nicotine dependence: Secondary | ICD-10-CM | POA: Insufficient documentation

## 2021-07-15 DIAGNOSIS — Z9889 Other specified postprocedural states: Secondary | ICD-10-CM | POA: Diagnosis present

## 2021-07-15 DIAGNOSIS — K219 Gastro-esophageal reflux disease without esophagitis: Secondary | ICD-10-CM | POA: Diagnosis not present

## 2021-07-15 DIAGNOSIS — I10 Essential (primary) hypertension: Secondary | ICD-10-CM | POA: Diagnosis not present

## 2021-07-15 HISTORY — PX: ROBOTIC ASSISTED TOTAL HYSTERECTOMY WITH BILATERAL SALPINGO OOPHERECTOMY: SHX6086

## 2021-07-15 LAB — PREGNANCY, URINE: Preg Test, Ur: NEGATIVE

## 2021-07-15 SURGERY — HYSTERECTOMY, TOTAL, ROBOT-ASSISTED, LAPAROSCOPIC, WITH BILATERAL SALPINGO-OOPHORECTOMY
Anesthesia: General

## 2021-07-15 MED ORDER — PROPOFOL 10 MG/ML IV BOLUS
INTRAVENOUS | Status: AC
  Filled 2021-07-15: qty 20

## 2021-07-15 MED ORDER — OXYCODONE HCL 5 MG PO TABS: 5.0000 mg | ORAL_TABLET | Freq: Once | ORAL | Status: DC | PRN

## 2021-07-15 MED ORDER — FLUORESCEIN SODIUM 10 % IV SOLN
INTRAVENOUS | Status: AC
  Filled 2021-07-15: qty 5

## 2021-07-15 MED ORDER — LABETALOL HCL 100 MG PO TABS
200.0000 mg | ORAL_TABLET | Freq: Once | ORAL | Status: AC
  Administered 2021-07-15: 200 mg via ORAL
  Filled 2021-07-15: qty 2

## 2021-07-15 MED ORDER — ORAL CARE MOUTH RINSE
15.0000 mL | Freq: Once | OROMUCOSAL | Status: AC
  Administered 2021-07-15: 15 mL via OROMUCOSAL

## 2021-07-15 MED ORDER — POVIDONE-IODINE 10 % EX SWAB: 2.0000 "application " | Freq: Once | CUTANEOUS | Status: DC

## 2021-07-15 MED ORDER — CHLORHEXIDINE GLUCONATE 0.12 % MT SOLN: 15.0000 mL | Freq: Once | OROMUCOSAL | Status: AC

## 2021-07-15 MED ORDER — GABAPENTIN 300 MG PO CAPS
300.0000 mg | ORAL_CAPSULE | ORAL | Status: AC
  Administered 2021-07-15: 300 mg via ORAL
  Filled 2021-07-15: qty 1

## 2021-07-15 MED ORDER — KETOROLAC TROMETHAMINE 30 MG/ML IJ SOLN
INTRAMUSCULAR | Status: AC
  Filled 2021-07-15: qty 1

## 2021-07-15 MED ORDER — FENTANYL CITRATE PF 50 MCG/ML IJ SOSY
PREFILLED_SYRINGE | INTRAMUSCULAR | Status: AC
  Administered 2021-07-15: 50 ug via INTRAVENOUS
  Filled 2021-07-15: qty 3

## 2021-07-15 MED ORDER — FENTANYL CITRATE (PF) 100 MCG/2ML IJ SOLN
INTRAMUSCULAR | Status: AC
  Filled 2021-07-15: qty 2

## 2021-07-15 MED ORDER — PROPOFOL 10 MG/ML IV BOLUS
INTRAVENOUS | Status: DC | PRN
  Administered 2021-07-15: 200 mg via INTRAVENOUS

## 2021-07-15 MED ORDER — LABETALOL HCL 5 MG/ML IV SOLN
INTRAVENOUS | Status: DC | PRN
  Administered 2021-07-15 (×2): 5 mg via INTRAVENOUS

## 2021-07-15 MED ORDER — ROPIVACAINE HCL 5 MG/ML IJ SOLN
INTRAMUSCULAR | Status: AC
  Filled 2021-07-15: qty 30

## 2021-07-15 MED ORDER — SOD CITRATE-CITRIC ACID 500-334 MG/5ML PO SOLN
30.0000 mL | ORAL | Status: DC
  Filled 2021-07-15: qty 30

## 2021-07-15 MED ORDER — SUGAMMADEX SODIUM 500 MG/5ML IV SOLN
INTRAVENOUS | Status: AC
  Filled 2021-07-15: qty 5

## 2021-07-15 MED ORDER — DEXAMETHASONE SODIUM PHOSPHATE 10 MG/ML IJ SOLN
INTRAMUSCULAR | Status: DC | PRN
  Administered 2021-07-15: 10 mg via INTRAVENOUS

## 2021-07-15 MED ORDER — ONDANSETRON HCL 4 MG/2ML IJ SOLN
INTRAMUSCULAR | Status: AC
  Filled 2021-07-15: qty 2

## 2021-07-15 MED ORDER — LIDOCAINE HCL 2 % IJ SOLN
INTRAMUSCULAR | Status: AC
  Filled 2021-07-15: qty 20

## 2021-07-15 MED ORDER — FENTANYL CITRATE PF 50 MCG/ML IJ SOSY
25.0000 ug | PREFILLED_SYRINGE | INTRAMUSCULAR | Status: DC | PRN
  Administered 2021-07-15 (×2): 50 ug via INTRAVENOUS

## 2021-07-15 MED ORDER — DEXAMETHASONE SODIUM PHOSPHATE 10 MG/ML IJ SOLN
INTRAMUSCULAR | Status: AC
  Filled 2021-07-15: qty 1

## 2021-07-15 MED ORDER — FENTANYL CITRATE (PF) 250 MCG/5ML IJ SOLN
INTRAMUSCULAR | Status: DC | PRN
  Administered 2021-07-15 (×2): 50 ug via INTRAVENOUS

## 2021-07-15 MED ORDER — KETAMINE HCL 10 MG/ML IJ SOLN
INTRAMUSCULAR | Status: DC | PRN
  Administered 2021-07-15 (×5): 10 mg via INTRAVENOUS

## 2021-07-15 MED ORDER — MIDAZOLAM HCL 5 MG/5ML IJ SOLN
INTRAMUSCULAR | Status: DC | PRN
Start: 2021-07-15 — End: 2021-07-15
  Administered 2021-07-15 (×2): 1 mg via INTRAVENOUS

## 2021-07-15 MED ORDER — LABETALOL HCL 5 MG/ML IV SOLN
INTRAVENOUS | Status: AC
  Filled 2021-07-15: qty 4

## 2021-07-15 MED ORDER — LIDOCAINE 20MG/ML (2%) 15 ML SYRINGE OPTIME
INTRAMUSCULAR | Status: DC | PRN
  Administered 2021-07-15: 1.5 mg/kg/h via INTRAVENOUS

## 2021-07-15 MED ORDER — LIDOCAINE 2% (20 MG/ML) 5 ML SYRINGE
INTRAMUSCULAR | Status: DC | PRN
  Administered 2021-07-15: 100 mg via INTRAVENOUS

## 2021-07-15 MED ORDER — ROCURONIUM BROMIDE 10 MG/ML (PF) SYRINGE
PREFILLED_SYRINGE | INTRAVENOUS | Status: AC
  Filled 2021-07-15: qty 10

## 2021-07-15 MED ORDER — SODIUM CHLORIDE (PF) 0.9 % IJ SOLN
INTRAMUSCULAR | Status: AC
  Filled 2021-07-15: qty 50

## 2021-07-15 MED ORDER — SCOPOLAMINE 1 MG/3DAYS TD PT72SCOPOLAMINE 1 MG/3DAYS
1.0000 | MEDICATED_PATCH | Freq: Once | TRANSDERMAL | Status: DC
Start: 2021-07-15 — End: 2021-07-15
  Administered 2021-07-15: 1.5 mg via TRANSDERMAL
  Filled 2021-07-15: qty 1

## 2021-07-15 MED ORDER — LACTATED RINGERS IV SOLN: INTRAVENOUS | Status: DC

## 2021-07-15 MED ORDER — OXYCODONE-ACETAMINOPHEN 5-325 MG PO TABS: 1.0000 | ORAL_TABLET | ORAL | 0 refills | Status: DC | PRN

## 2021-07-15 MED ORDER — MIDAZOLAM HCL 2 MG/2ML IJ SOLN
INTRAMUSCULAR | Status: AC
  Filled 2021-07-15: qty 2

## 2021-07-15 MED ORDER — ONDANSETRON HCL 4 MG/2ML IJ SOLN: 4.0000 mg | Freq: Four times a day (QID) | INTRAMUSCULAR | Status: DC | PRN

## 2021-07-15 MED ORDER — KETOROLAC TROMETHAMINE 30 MG/ML IJ SOLN
INTRAMUSCULAR | Status: DC | PRN
  Administered 2021-07-15: 30 mg via INTRAVENOUS

## 2021-07-15 MED ORDER — SODIUM CHLORIDE 0.9 % IV SOLN
INTRAVENOUS | Status: DC | PRN
  Administered 2021-07-15: 120 mL

## 2021-07-15 MED ORDER — MENTHOL 3 MG MT LOZG: 1.0000 | LOZENGE | OROMUCOSAL | Status: DC | PRN

## 2021-07-15 MED ORDER — FLUORESCEIN SODIUM 10 % IV SOLN
INTRAVENOUS | Status: DC | PRN
  Administered 2021-07-15: 100 mg via INTRAVENOUS

## 2021-07-15 MED ORDER — ACETAMINOPHEN 500 MG PO TABS
1000.0000 mg | ORAL_TABLET | Freq: Once | ORAL | Status: DC
  Filled 2021-07-15: qty 2

## 2021-07-15 MED ORDER — ROCURONIUM BROMIDE 10 MG/ML (PF) SYRINGE
PREFILLED_SYRINGE | INTRAVENOUS | Status: DC | PRN
  Administered 2021-07-15: 100 mg via INTRAVENOUS
  Administered 2021-07-15: 20 mg via INTRAVENOUS

## 2021-07-15 MED ORDER — SENNA 8.6 MG PO TABS
1.0000 | ORAL_TABLET | Freq: Two times a day (BID) | ORAL | Status: DC
  Administered 2021-07-15: 8.6 mg via ORAL
  Filled 2021-07-15: qty 1

## 2021-07-15 MED ORDER — IBUPROFEN 400 MG PO TABS
800.0000 mg | ORAL_TABLET | Freq: Three times a day (TID) | ORAL | Status: DC
  Administered 2021-07-15: 800 mg via ORAL
  Filled 2021-07-15: qty 2

## 2021-07-15 MED ORDER — DIPHENHYDRAMINE HCL 25 MG PO CAPS: 50.0000 mg | ORAL_CAPSULE | Freq: Four times a day (QID) | ORAL | Status: DC | PRN

## 2021-07-15 MED ORDER — SUGAMMADEX SODIUM 500 MG/5ML IV SOLN
INTRAVENOUS | Status: DC | PRN
  Administered 2021-07-15: 350 mg via INTRAVENOUS

## 2021-07-15 MED ORDER — SODIUM CHLORIDE 0.9 % IR SOLN
Status: DC | PRN
  Administered 2021-07-15: 3000 mL

## 2021-07-15 MED ORDER — ONDANSETRON HCL 4 MG/2ML IJ SOLN
INTRAMUSCULAR | Status: DC | PRN
  Administered 2021-07-15: 4 mg via INTRAVENOUS

## 2021-07-15 MED ORDER — KETAMINE HCL 50 MG/5ML IJ SOSY
PREFILLED_SYRINGE | INTRAMUSCULAR | Status: AC
  Filled 2021-07-15: qty 5

## 2021-07-15 MED ORDER — CELECOXIB 200 MG PO CAPS
400.0000 mg | ORAL_CAPSULE | ORAL | Status: AC
  Administered 2021-07-15: 400 mg via ORAL
  Filled 2021-07-15: qty 2

## 2021-07-15 MED ORDER — DROPERIDOL 2.5 MG/ML IJ SOLN: 0.6250 mg | Freq: Once | INTRAMUSCULAR | Status: DC | PRN

## 2021-07-15 MED ORDER — CEFAZOLIN IN SODIUM CHLORIDE 3-0.9 GM/100ML-% IV SOLN
3.0000 g | INTRAVENOUS | Status: AC
  Administered 2021-07-15: 3 g via INTRAVENOUS
  Filled 2021-07-15: qty 100

## 2021-07-15 MED ORDER — KETOROLAC TROMETHAMINE 15 MG/ML IJ SOLN
15.0000 mg | INTRAMUSCULAR | Status: DC
  Filled 2021-07-15: qty 1

## 2021-07-15 MED ORDER — ACETAMINOPHEN 500 MG PO TABS
1000.0000 mg | ORAL_TABLET | ORAL | Status: AC
  Administered 2021-07-15: 1000 mg via ORAL

## 2021-07-15 MED ORDER — ONDANSETRON HCL 4 MG PO TABS: 4.0000 mg | ORAL_TABLET | Freq: Four times a day (QID) | ORAL | Status: DC | PRN

## 2021-07-15 MED ORDER — OXYCODONE HCL 5 MG/5ML PO SOLN: 5.0000 mg | Freq: Once | ORAL | Status: DC | PRN

## 2021-07-15 MED ORDER — KETOROLAC TROMETHAMINE 15 MG/ML IJ SOLN
INTRAMUSCULAR | Status: AC
  Filled 2021-07-15: qty 1

## 2021-07-15 SURGICAL SUPPLY — 61 items
ADH SKN CLS APL DERMABOND .7 (GAUZE/BANDAGES/DRESSINGS) ×1
APL PRP STRL LF DISP 70% ISPRP (MISCELLANEOUS) ×1
APL SRG 38 LTWT LNG FL B (MISCELLANEOUS) ×1
APPLICATOR ARISTA FLEXITIP XL (MISCELLANEOUS) ×1 IMPLANT
BARRIER ADHS 3X4 INTERCEED (GAUZE/BANDAGES/DRESSINGS) IMPLANT
BRR ADH 4X3 ABS CNTRL BYND (GAUZE/BANDAGES/DRESSINGS)
CANISTER SUCT 3000ML PPV (MISCELLANEOUS) ×3 IMPLANT
CATH FOLEY 2WAY SLVR  5CC 16FR (CATHETERS) ×2
CATH FOLEY 2WAY SLVR 5CC 16FR (CATHETERS) ×2 IMPLANT
CHLORAPREP W/TINT 26 (MISCELLANEOUS) ×3 IMPLANT
COVER BACK TABLE 60X90IN (DRAPES) ×3 IMPLANT
COVER TIP SHEARS 8 DVNC (MISCELLANEOUS) ×2 IMPLANT
COVER TIP SHEARS 8MM DA VINCI (MISCELLANEOUS) ×2
DEFOGGER SCOPE WARMER CLEARIFY (MISCELLANEOUS) ×3 IMPLANT
DERMABOND ADVANCED (GAUZE/BANDAGES/DRESSINGS) ×1
DERMABOND ADVANCED .7 DNX12 (GAUZE/BANDAGES/DRESSINGS) ×2 IMPLANT
DRAPE ARM DVNC X/XI (DISPOSABLE) ×8 IMPLANT
DRAPE COLUMN DVNC XI (DISPOSABLE) ×2 IMPLANT
DRAPE DA VINCI XI ARM (DISPOSABLE) ×8
DRAPE DA VINCI XI COLUMN (DISPOSABLE) ×2
ELECT REM PT RETURN 15FT ADLT (MISCELLANEOUS) ×3 IMPLANT
GLOVE SURG ENC MOIS LTX SZ7 (GLOVE) ×9 IMPLANT
GLOVE SURG UNDER POLY LF SZ7 (GLOVE) ×6 IMPLANT
HEMOSTAT ARISTA ABSORB 3G PWDR (HEMOSTASIS) ×1 IMPLANT
IRRIG SUCT STRYKERFLOW 2 WTIP (MISCELLANEOUS) ×2
IRRIGATION SUCT STRKRFLW 2 WTP (MISCELLANEOUS) ×2 IMPLANT
KIT TURNOVER KIT A (KITS) IMPLANT
LEGGING LITHOTOMY PAIR STRL (DRAPES) ×3 IMPLANT
MANIPULATOR ADVINCU DEL 2.5 PL (MISCELLANEOUS) IMPLANT
MANIPULATOR ADVINCU DEL 3.0 PL (MISCELLANEOUS) ×1 IMPLANT
MANIPULATOR ADVINCU DEL 3.5 PL (MISCELLANEOUS) IMPLANT
MANIPULATOR ADVINCU DEL 4.0 PL (MISCELLANEOUS) IMPLANT
NDL INSUFFLATION 14GA 120MM (NEEDLE) ×2 IMPLANT
NDL INSUFFLATION 14GA 150MM (NEEDLE) IMPLANT
NEEDLE INSUFFLATION 14GA 120MM (NEEDLE) ×2 IMPLANT
NEEDLE INSUFFLATION 14GA 150MM (NEEDLE) ×2 IMPLANT
OBTURATOR OPTICAL STANDARD 8MM (TROCAR) ×2
OBTURATOR OPTICAL STND 8 DVNC (TROCAR) ×1
OBTURATOR OPTICALSTD 8 DVNC (TROCAR) ×2 IMPLANT
PACK ROBOT WH (CUSTOM PROCEDURE TRAY) ×3 IMPLANT
PACK ROBOTIC GOWN (GOWN DISPOSABLE) ×3 IMPLANT
PACK TRENDGUARD 450 HYBRID PRO (MISCELLANEOUS) IMPLANT
PAD PREP 24X48 CUFFED NSTRL (MISCELLANEOUS) ×3 IMPLANT
PENCIL SMOKE EVACUATOR (MISCELLANEOUS) IMPLANT
PROTECTOR NERVE ULNAR (MISCELLANEOUS) ×6 IMPLANT
SEAL CANN UNIV 5-8 DVNC XI (MISCELLANEOUS) ×6 IMPLANT
SEAL XI 5MM-8MM UNIVERSAL (MISCELLANEOUS) ×6
SET IRRIG Y TYPE TUR BLADDER L (SET/KITS/TRAYS/PACK) ×3 IMPLANT
SET TRI-LUMEN FLTR TB AIRSEAL (TUBING) ×3 IMPLANT
SPIKE FLUID TRANSFER (MISCELLANEOUS) ×6 IMPLANT
SUT DVC VLOC 180 0 12IN GS21 (SUTURE)
SUT VIC AB 2-0 CT2 27 (SUTURE) ×6 IMPLANT
SUT VIC AB 2-0 UR6 27 (SUTURE) IMPLANT
SUT VICRYL RAPIDE 3 0 (SUTURE) ×6 IMPLANT
SUT VLOC 180 0 9IN  GS21 (SUTURE) ×2
SUT VLOC 180 0 9IN GS21 (SUTURE) ×2 IMPLANT
SUTURE DVC VLC 180 0 12IN GS21 (SUTURE) IMPLANT
TOWEL OR 17X26 10 PK STRL BLUE (TOWEL DISPOSABLE) ×3 IMPLANT
TRENDGUARD 450 HYBRID PRO PACK (MISCELLANEOUS)
TROCAR PORT AIRSEAL 5X120 (TROCAR) ×4 IMPLANT
WATER STERILE IRR 1000ML POUR (IV SOLUTION) ×3 IMPLANT

## 2021-07-15 NOTE — Anesthesia Procedure Notes (Signed)
Procedure Name: Intubation ?Date/Time: 07/15/2021 7:44 AM ?Performed by: Galen Russman D, CRNA ?Pre-anesthesia Checklist: Patient identified, Emergency Drugs available, Suction available and Patient being monitored ?Patient Re-evaluated:Patient Re-evaluated prior to induction ?Oxygen Delivery Method: Circle system utilized ?Preoxygenation: Pre-oxygenation with 100% oxygen ?Induction Type: IV induction ?Ventilation: Mask ventilation without difficulty ?Laryngoscope Size: Mac and 4 ?Grade View: Grade I ?Tube type: Oral ?Number of attempts: 1 ?Airway Equipment and Method: Stylet ?Placement Confirmation: ETT inserted through vocal cords under direct vision, positive ETCO2 and breath sounds checked- equal and bilateral ?Secured at: 22 cm ?Tube secured with: Tape ?Dental Injury: Teeth and Oropharynx as per pre-operative assessment  ? ? ? ? ?

## 2021-07-15 NOTE — Brief Op Note (Signed)
07/15/2021 ? ?10:06 AM ? ?PATIENT:  Cheyenne Lyons  41 y.o. female ? ?PRE-OPERATIVE DIAGNOSIS:  endometriosis of uterus ? ?POST-OPERATIVE DIAGNOSIS:  endometriosis of uterus ? ?PROCEDURE:  Procedure(s): ?XI ROBOTIC ASSISTED HYSTERECTOMY WITH BILATERAL SALPINGECTOMY (N/A) ? ?SURGEON:  Surgeon(s) and Role: ?   Carrington Clamp, MD - Primary ?   Charlett Nose, MD - Assisting ? ?ANESTHESIA:   general ? ?EBL:  150 mL  ? ?LOCAL MEDICATIONS USED:  OTHER Fluorocein, Arista, Ropivicaine ? ?SPECIMEN:  Source of Specimen:  Uterus, cervix and tubes ? ?DISPOSITION OF SPECIMEN:  PATHOLOGY ? ?COUNTS:  YES ? ?TOURNIQUET:  * No tourniquets in log * ? ?DICTATION: .Note written in EPIC ? ?PLAN OF CARE: Admit for overnight observation ? ?PATIENT DISPOSITION:  PACU - hemodynamically stable. ?  ?Delay start of Pharmacological VTE agent (>24hrs) due to surgical blood loss or risk of bleeding: not applicable ? ?

## 2021-07-15 NOTE — Discharge Summary (Signed)
Physician Discharge Summary  ?Patient ID: ?Lanna Poche ?MRN: [id number] ?DOB/AGE: 42-Sep-1982 42 y.o. ? ?Admit date: 07/15/2021 ?Discharge date: 07/15/2021 ? ?Admission Diagnoses: ? ?Discharge Diagnoses:  ?Active Problems: ?  Postoperative state ? ? ?Discharged Condition: good ? ?Hospital Course: robotic assisted total laparoscopic hysterectomy with bilateral salpingectomies, cystoscopy ?Consults: None ? ?Significant Diagnostic Studies: none ? ?Treatments: surgery: robotic assisted total laparoscopic hysterectomy with bilateral salpingectomies, cystoscopy ? ?Discharge Exam: ?Blood pressure (!) 147/83, pulse 75, temperature 98.4 ?F (36.9 ?C), temperature source Oral, resp. rate 18, SpO2 100 %, currently breastfeeding. ? ? ?Disposition: Discharge disposition: 01-Home or Self Care ? ? ? ? ? ? ?Discharge Instructions   ? ? Call MD for:  temperature >100.4   Complete by: As directed ?  ? Diet - low sodium heart healthy   Complete by: As directed ?  ? Discharge instructions   Complete by: As directed ?  ? No driving on narcotics, no sexual activity for 2 weeks.  ? Increase activity slowly   Complete by: As directed ?  ? May shower / Bathe   Complete by: As directed ?  ? Shower, no bath for 2 weeks.  ? Remove dressing in 24 hours   Complete by: As directed ?  ? Sexual Activity Restrictions   Complete by: As directed ?  ? No sexual activity for 2 weeks.  ? ?  ? ?Allergies as of 07/15/2021   ?No Known Allergies ?  ? ?  ?Medication List  ?  ? ?TAKE these medications   ? ?BC FAST PAIN RELIEF PO ?Take 2 packets by mouth daily as needed (pain). ?  ?diphenhydrAMINE 25 MG tablet ?Commonly known as: BENADRYL ?Take 50 mg by mouth every 6 (six) hours as needed for allergies. ?  ?ibuprofen 800 MG tablet ?Commonly known as: ADVIL ?Take 800 mg by mouth every 8 (eight) hours as needed for moderate pain. ?  ?oxyCODONE-acetaminophen 5-325 MG tablet ?Commonly known as: PERCOCET/ROXICET ?Take 1 tablet by mouth every 4 (four) hours as needed for  severe pain. ?  ?phenazopyridine 100 MG tablet ?Commonly known as: PYRIDIUM ?Take 100 mg by mouth every 8 (eight) hours as needed for pain. ?  ?Salonpas Pain Relief Patch Ptch ?Apply 2 patches topically daily as needed (pain). ?  ? ?  ? ? Follow-up Information   ? ? Carrington Clamp, MD Follow up in 2 week(s).   ?Specialty: Obstetrics and Gynecology ?Contact information: ?[address] ?[address] ?[place] [address] ?[phone] ? ? ?  ?  ? ?  ?  ? ?  ? ? ?Signed: ?Loney Laurence ?07/15/2021, 10:22 AM ? ? ?

## 2021-07-15 NOTE — Progress Notes (Signed)
Pt has walked in hall multiple times, tolerating foods/drinks, tolerating PO meds, voiding, pain controlled.  ? ?Discharge instructions given to pt. Husband at bedside. All questions answered. Pt verbalized understanding. Ivs removed.  ?

## 2021-07-15 NOTE — Anesthesia Postprocedure Evaluation (Signed)
Anesthesia Post Note ? ?Patient: Cheyenne Lyons ? ?Procedure(s) Performed: XI ROBOTIC ASSISTED HYSTERECTOMY WITH BILATERAL SALPINGECTOMY ? ?  ? ?Patient location during evaluation: PACU ?Anesthesia Type: General ?Level of consciousness: awake and alert ?Pain management: pain level controlled ?Vital Signs Assessment: post-procedure vital signs reviewed and stable ?Respiratory status: spontaneous breathing, nonlabored ventilation and respiratory function stable ?Cardiovascular status: blood pressure returned to baseline ?Postop Assessment: no apparent nausea or vomiting ?Anesthetic complications: no ? ? ?No notable events documented. ? ?Last Vitals:  ?Vitals:  ? 07/15/21 1143 07/15/21 1222  ?BP: (!) 155/91 (!) 159/104  ?Pulse: 71 68  ?Resp: 14 16  ?Temp: 36.9 ?C   ?SpO2: 99% 100%  ?  ?Last Pain:  ?Vitals:  ? 07/15/21 1143  ?TempSrc: Oral  ?PainSc:   ? ? ?  ?  ?  ?  ?  ?  ? ?Shanda Howells ? ? ? ? ?

## 2021-07-15 NOTE — Progress Notes (Signed)
There has been no change in the patients history, status or exam since the history and physical. ? ?Vitals:  ? 07/15/21 0640  ?BP: (!) 147/83  ?Pulse: 75  ?Resp: 18  ?Temp: 98.4 ?F (36.9 ?C)  ?TempSrc: Oral  ?SpO2: 100%  ? ? ?Results for orders placed or performed during the hospital encounter of 07/15/21 (from the past 72 hour(s))  ?Pregnancy, urine     Status: None  ? Collection Time: 07/15/21  5:45 AM  ?Result Value Ref Range  ? Preg Test, Ur NEGATIVE NEGATIVE  ?  Comment:        ?THE SENSITIVITY OF THIS ?METHODOLOGY IS >20 mIU/mL. ?Performed at [facility], [address], [place], [place] [address] ?  ? ? ?Loney Laurence  ?

## 2021-07-15 NOTE — Transfer of Care (Signed)
Immediate Anesthesia Transfer of Care Note ? ?Patient: Cheyenne Lyons ? ?Procedure(s) Performed: XI ROBOTIC ASSISTED HYSTERECTOMY WITH BILATERAL SALPINGECTOMY ? ?Patient Location: PACU ? ?Anesthesia Type:General ? ?Level of Consciousness: awake, alert  and oriented ? ?Airway & Oxygen Therapy: Patient Spontanous Breathing and Patient connected to face mask oxygen ? ?Post-op Assessment: Report given to RN and Post -op Vital signs reviewed and stable ? ?Post vital signs: Reviewed and stable ? ?Last Vitals:  ?Vitals Value Taken Time  ?BP 162/110 07/15/21 1020  ?Temp    ?Pulse 73 07/15/21 1023  ?Resp 25 07/15/21 1023  ?SpO2 100 % 07/15/21 1023  ?Vitals shown include unvalidated device data. ? ?Last Pain:  ?Vitals:  ? 07/15/21 0640  ?TempSrc: Oral  ?   ? ?  ? ?Complications: No notable events documented. ?

## 2021-07-15 NOTE — Op Note (Signed)
07/15/2021 ? ?10:06 AM ? ?PATIENT:  Cheyenne Lyons  41 y.o. female ? ?PRE-OPERATIVE DIAGNOSIS:  endometriosis of uterus ? ?POST-OPERATIVE DIAGNOSIS:  endometriosis of uterus ? ?PROCEDURE:  Procedure(s): ?XI ROBOTIC ASSISTED HYSTERECTOMY WITH BILATERAL SALPINGECTOMY (N/Lyons), cystoscopy, cautery of endometriosis ? ?SURGEON:  Surgeon(s) and Role: ?   Carrington Clamp, MD - Primary ?   Charlett Nose, MD - Assisting ? ?ANESTHESIA:   general ? ?EBL:  150 mL  ? ?LOCAL MEDICATIONS USED:  OTHER Fluorocein, Arista, Ropivicaine ? ?SPECIMEN:  Source of Specimen:  Uterus, cervix and tubes ? ?DISPOSITION OF SPECIMEN:  PATHOLOGY ? ?COUNTS:  YES ? ?TOURNIQUET:  * No tourniquets in log * ? ?DICTATION: .Note written in EPIC ? ?PLAN OF CARE: Admit for overnight observation ? ?PATIENT DISPOSITION:  PACU - hemodynamically stable. ?  ?Delay start of Pharmacological VTE agent (>24hrs) due to surgical blood loss or risk of bleeding: not applicable ? ?Complications:  Inadvertent needle stick with veress- contaminated both myself and patient.  Safety portal zone done.  ? ?Findings:  9 weeks size very boggy uterus.  Advincula perforated uterus secondary endometrial cavity was scarred down. Ovaries were normal except for very small endometriotic cyst on the L - this was cauterized.  Mastersons defect in cul de sac was cauterized.  Filschie clips were removed from the pelvis. The ureters were identified during multiple points of the case and were always out of the field of dissection.  On cystoscopy, the bladder was intact and bilateral spill was seen from each ureteral orriface.   ?  ?Technique: ?  ?After adequate anesthesia was achieved the patient was positioned, prepped and draped in usual sterile fashion.  Lyons speculum was placed in the vagina and the cervix dilated with pratt dilators.  The 3 cm Koh ring Advincula was assembled and placed in proper fashion.  The  Speculum was removed and the bladder catheterized with Lyons foley.   ?   ?Attention was turned to the abdomen where Lyons 1 cm incision was made 1 cm above the umbilicus.  The veress needle was introduced without aspiration of bowel contents or blood and the abdomen insufflated. The 8.5 mm Robotic trocar was placed and the other three trocar sites were marked out, all approximately 10 cm from each other and the camera.  Two 8.5 mm trocars were placed on either side of the camera port and Lyons 5 mm assistant port was placed 3 cm above the line of the other trocars.  All trocars were inserted under direct visualization of the camera.  The patient was placed in trendelenburg and then the Robot docked.  The fenestrated bipolar were placed on arm 1 and the Hot shears on arm 3 and introduced under direct visualization of the camera. ?  ?I then broke scrub and sat down at the console.  The above findings were noted and the ureters identified well out of the field of dissection.  The right fallopian tube was isolated and cauterized with the bipolar.  The Utero-ovarian ligament was then divided with the bipolar cautery and shears.  The posterior broad ligament was then divided with the hot shears until the uterosacral ligament.  The Broad and cardinal ligaments were then cauterized against the cervix to the level of the Koh ring, securing the uterine artery.  Each pedicle was then incised with the shears.  The anterior leaf was then incised at the reflection of the vessico-uterine junction and the lateral bladder retracted inferiorly after the round  ligament had been divided with the bipolar forceps.  The left tube was cauterized with the bipolar and divided with the shears;  then the left utero-ovarian ligament divided with the bipolar forceps and the scissors.  The round ligament was divided as well and the posterior leaf of the broad ligament then divided with the hot shears. The broad and cardinal ligaments were then cauterized on the left in the same way.   At the level of the internal os, the  uterine arteries were bilaterally cauterized with the bipolar.  The ureters were identified well out of the field of dissection.  The filschie clips were loose and removed from the body.  The endometriotic cyst on the L was cauterized to its bed.  The mastersons defect was cauterized. ?  ?The bladder was then able to be retracted inferiorly and the vesico-uterine fascia was incised in the midline until the bladder was removed one cm below the Koh ring.  The hot shears then circumferentially incised the vagina at the level of the reflection on the [facility] ring.  Once the uterus and cervix were amputated, cautery was used to insure hemostasis of the cuff.  Once hemostasis was achieved, the scissors were changed to the mega suture cut needle driver and the cuff was closed with Lyons running stitches of 0-vicryl V loc.  Cautery was used to ensure hemostasis of the left pedicles very superficially. The ureters were peristalsing bilaterally well and very lateral to the areas of operation.   ?  ?The Robot was then undocked and I scrubbed back in.  The needle was removed and Arista and Ropivicaine were introduced into the pelvis. The skin incisions were closed with subcuticular stitches of 3-0 vicryl Rapide and Dermabond.  All instruments were removed from the vagina and cystoscopy performed, revealing an intact bladder and vigourous spill of urine from each ureteral orifice.  The cystoscope was removed and the patient taken to the recovery room in stable condition. ?  ?Cheyenne Lyons ? ? ?  ?

## 2021-07-15 NOTE — Progress Notes (Signed)
This RN received in report that MD should be called when pt gets to the floor. Philis Pique called, verbal diet order given. MD states that all DC orders are in, and pt should not stay the night. MD states pt is good to DC when goals of walking, tolerating PO meds, pain controlled with PO meds are met.  ?

## 2021-07-16 ENCOUNTER — Encounter (HOSPITAL_COMMUNITY): Payer: Self-pay | Admitting: Obstetrics and Gynecology

## 2021-07-18 LAB — SURGICAL PATHOLOGY

## 2022-05-05 ENCOUNTER — Ambulatory Visit: Payer: BC Managed Care – PPO | Admitting: Podiatry

## 2022-05-05 ENCOUNTER — Encounter: Payer: Self-pay | Admitting: Podiatry

## 2022-05-05 ENCOUNTER — Ambulatory Visit (INDEPENDENT_AMBULATORY_CARE_PROVIDER_SITE_OTHER): Payer: BC Managed Care – PPO

## 2022-05-05 DIAGNOSIS — M722 Plantar fascial fibromatosis: Secondary | ICD-10-CM

## 2022-05-05 MED ORDER — DICLOFENAC SODIUM 75 MG PO TBEC: 75.0000 mg | DELAYED_RELEASE_TABLET | Freq: Two times a day (BID) | ORAL | 2 refills | Status: AC

## 2022-05-05 MED ORDER — TRIAMCINOLONE ACETONIDE 10 MG/ML IJ SUSP
10.0000 mg | Freq: Once | INTRAMUSCULAR | Status: AC
  Administered 2022-05-05: 10 mg

## 2022-05-05 NOTE — Progress Notes (Signed)
Subjective:   Patient ID: Cheyenne Lyons, female   DOB: 42 y.o.   MRN: [id number]   HPI Patient presents 78-month history of pain in the left heel stating that it is very bad and also starting to affect the Achilles tendon.  Patient is trying to be very active trying to work at weight loss and is doing a lot of cardio and has not been able to has tried over-the-counter inserts pain patches.  Patient does not smoke likes to be active if possible   Review of Systems  All other systems reviewed and are negative.       Objective:  Physical Exam Vitals and nursing note reviewed.  Constitutional:      Appearance: She is well-developed.  Pulmonary:     Effort: Pulmonary effort is normal.  Musculoskeletal:        General: Normal range of motion.  Skin:    General: Skin is warm.  Neurological:     Mental Status: She is alert.     Neurovascular status intact muscle strength found to be adequate range of motion adequate exquisite discomfort plantar fascia at the insertion of the left with fluid buildup moderate flattening of the arch noted.  Good digital perfusion well-oriented x 3     Assessment:  Acute plantar fasciitis left with fluid buildup with flatfoot deformity     Plan:  H&P discussed activity levels reviewed x-rays and went ahead today did sterile prep injected the fascia at insertion 3 mg Kenalog 5 mg Xylocaine and applied fascial brace properly fitted to her arch to lift up the foot.  Discussed long-term orthotics we will make that decision and placed on diclofenac 75 mg twice daily along with instructions for stretching exercises and shoe gear modifications.  X-rays indicate spur and moderate flatfoot deformity as part of her structure

## 2022-05-05 NOTE — Patient Instructions (Signed)

## 2022-05-22 ENCOUNTER — Encounter: Payer: Self-pay | Admitting: Podiatry

## 2022-05-22 ENCOUNTER — Ambulatory Visit (INDEPENDENT_AMBULATORY_CARE_PROVIDER_SITE_OTHER): Payer: BC Managed Care – PPO | Admitting: Podiatry

## 2022-05-22 DIAGNOSIS — M722 Plantar fascial fibromatosis: Secondary | ICD-10-CM | POA: Diagnosis not present

## 2022-05-22 NOTE — Progress Notes (Signed)
Subjective:   Patient ID: Cheyenne Lyons, female   DOB: 42 y.o.   MRN: [id number]   HPI Patient states she has had some improvement of her heel but still has a lot of pain and states that she is trying to be more active now and trying to lose weight and this has made it difficult   ROS      Objective:  Physical Exam  Neurovascular status intact exquisite discomfort still noted plantar heel improved from previous visit but present with moderate depression of the arch and obesity is complicating factors     Assessment:  Inflammatory condition plantar heel with obesity and flatfoot deformity as part of the issue     Plan:  H&P reviewed condition at great length discussed treatment options and reviewed possible PRP shockwave surgery but at this point went ahead and dispensed night splint with all instructions on usage and casted for functional orthotics to reduce pressure on the feet.  All questions answered reappoint when ready or earlier if needed

## 2022-07-10 ENCOUNTER — Ambulatory Visit (INDEPENDENT_AMBULATORY_CARE_PROVIDER_SITE_OTHER): Payer: BC Managed Care – PPO

## 2022-07-10 DIAGNOSIS — M722 Plantar fascial fibromatosis: Secondary | ICD-10-CM

## 2022-07-10 NOTE — Progress Notes (Signed)
Patient presents today to pick up custom molded foot orthotics recommended by Dr. Paulla Dolly.   Orthotics were dispensed and fit was satisfactory. Reviewed instructions for break-in and wear. Written instructions given to patient.  Patient will follow up as needed.   [facility] - order # [id number]

## 2022-09-04 ENCOUNTER — Encounter: Payer: Self-pay | Admitting: Podiatry

## 2022-09-04 ENCOUNTER — Ambulatory Visit (INDEPENDENT_AMBULATORY_CARE_PROVIDER_SITE_OTHER): Payer: BC Managed Care – PPO | Admitting: Podiatry

## 2022-09-04 DIAGNOSIS — M722 Plantar fascial fibromatosis: Secondary | ICD-10-CM | POA: Diagnosis not present

## 2022-09-04 MED ORDER — TRIAMCINOLONE ACETONIDE 10 MG/ML IJ SUSP
10.0000 mg | Freq: Once | INTRAMUSCULAR | Status: AC
  Administered 2022-09-04: 10 mg

## 2022-09-04 NOTE — Progress Notes (Signed)
Subjective:   Patient ID: Cheyenne Lyons, female   DOB: 42 y.o.   MRN: [id number]   HPI Patient states she was doing very well but she has had a reoccurrence of pain in the left heel stating that it did well and came back about 2 to 3 weeks ago   ROS      Objective:  Physical Exam  Neurovascular status intact inflammation pain of the plantar heel left at the insertional point tendon calcaneus     Assessment:  Acute Planter fasciitis left with inflammation     Plan:  Sterile prep went ahead and reinjected the fascia 3 mg Kenalog 5 mg Xylocaine discussed the importance of the night splint which she admits she is not using and I want her to use that along with continued orthotic usage and encourage weight loss

## 2023-01-27 ENCOUNTER — Other Ambulatory Visit: Payer: Self-pay

## 2023-01-27 ENCOUNTER — Encounter (HOSPITAL_COMMUNITY): Payer: Self-pay

## 2023-01-27 ENCOUNTER — Emergency Department (HOSPITAL_COMMUNITY)
Admission: EM | Admit: 2023-01-27 | Discharge: 2023-01-27 | Disposition: A | Discharge status: Home / Self Care | Payer: BC Managed Care – PPO | Attending: Emergency Medicine | Admitting: Emergency Medicine

## 2023-01-27 DIAGNOSIS — I10 Essential (primary) hypertension: Secondary | ICD-10-CM | POA: Insufficient documentation

## 2023-01-27 DIAGNOSIS — K625 Hemorrhage of anus and rectum: Secondary | ICD-10-CM | POA: Diagnosis present

## 2023-01-27 LAB — COMPREHENSIVE METABOLIC PANEL
ALT: 12 U/L (ref 0–44)
AST: 16 U/L (ref 15–41)
Albumin: 3.6 g/dL (ref 3.5–5.0)
Alkaline Phosphatase: 80 U/L (ref 38–126)
Anion gap: 10 (ref 5–15)
BUN: 17 mg/dL (ref 6–20)
CO2: 25 mmol/L (ref 22–32)
Calcium: 9.1 mg/dL (ref 8.9–10.3)
Chloride: 99 mmol/L (ref 98–111)
Creatinine, Ser: 0.84 mg/dL (ref 0.44–1.00)
GFR, Estimated: >60 mL/min (ref >60)
Glucose, Bld: 97 mg/dL (ref 70–99)
Potassium: 4.3 mmol/L (ref 3.5–5.1)
Sodium: 134 mmol/L — ABNORMAL LOW (ref 135–145)
Total Bilirubin: 0.7 mg/dL (ref 0.3–1.2)
Total Protein: 7.8 g/dL (ref 6.5–8.1)

## 2023-01-27 LAB — CBC
HCT: 36.5 % (ref 36.0–46.0)
Hemoglobin: 11.8 g/dL — ABNORMAL LOW (ref 12.0–15.0)
MCH: 30.1 pg (ref 26.0–34.0)
MCHC: 32.3 g/dL (ref 30.0–36.0)
MCV: 93.1 fL (ref 80.0–100.0)
Platelets: 333 10*3/uL (ref 150–400)
RBC: 3.92 MIL/uL (ref 3.87–5.11)
RDW: 14.1 % (ref 11.5–15.5)
WBC: 5.7 10*3/uL (ref 4.0–10.5)
nRBC: 0 % (ref 0.0–0.2)

## 2023-01-27 LAB — POC OCCULT BLOOD, ED: Fecal Occult Bld: POSITIVE — AB

## 2023-01-27 NOTE — Discharge Instructions (Signed)
You have been seen today for your complaint of rectal bleeding. Your lab work was reassuring. Follow up with: Your PCP in 1 week for reevaluation Please seek immediate medical care if you develop any of the following symptoms: You faint. You have severe pain in your rectum. At this time there does not appear to be the presence of an emergent medical condition, however there is always the potential for conditions to change. Please read and follow the below instructions.  Do not take your medicine if  develop an itchy rash, swelling in your mouth or lips, or difficulty breathing; call 911 and seek immediate emergency medical attention if this occurs.  You may review your lab tests and imaging results in their entirety on your MyChart account.  Please discuss all results of fully with your primary care provider and other specialist at your follow-up visit.  Note: Portions of this text may have been transcribed using voice recognition software. Every effort was made to ensure accuracy; however, inadvertent computerized transcription errors may still be present.

## 2023-01-27 NOTE — ED Triage Notes (Addendum)
Pt noticed bright red blood in stool last night at apprx 1030. Pt does endorse some dizziness, diarrhea, and hx of hemorrhoids. Pt also recently treated for vaginal infection.

## 2023-01-27 NOTE — ED Provider Notes (Signed)
[place] EMERGENCY DEPARTMENT AT [facility] Provider Note   CSN: [id number] Arrival date & time: 01/27/23  4098     History  Chief Complaint  Patient presents with   Melena         Krystale Rinkenberger is a 42 y.o. female.  With a history of endometriosis, hypertension, anemia presenting to the ED for evaluation of bright red blood per rectum.  She states last night at approximately 10:30 PM she had a bowel movement with significant blood in the toilet.  The stool was reportedly brown and had no blood or melena in it.  Blood was mostly in the toilet bowl and on the toilet paper.  She denies any vaginal bleeding.  She denies any pain with bowel movements.  No change in bowel habits, unexplained weight loss, anorexia.  She had a colonoscopy in 2019 which was reportedly normal but she was told she has 1 hemorrhoid.  She states she had a more loose bowel movement today which had some blood in it as well, but less than yesterday.  She is not on any anticoagulation.  HPI     Home Medications Prior to Admission medications   Medication Sig Start Date End Date Taking? Authorizing Provider  Aspirin-Caffeine (BC FAST PAIN RELIEF PO) Take 2 packets by mouth daily as needed (pain).    [provider]  diclofenac (VOLTAREN) 75 MG EC tablet Take 1 tablet (75 mg total) by mouth 2 (two) times daily. 05/05/22   Lenn Sink, DPM  Menthol-Methyl Salicylate (SALONPAS PAIN RELIEF PATCH) PTCH Apply 2 patches topically daily as needed (pain).    [provider]  phenazopyridine (PYRIDIUM) 100 MG tablet Take 100 mg by mouth every 8 (eight) hours as needed for pain. 03/02/21   [provider]      Allergies    Patient has no known allergies.    Review of Systems   Review of Systems  Gastrointestinal:  Positive for anal bleeding.  All other systems reviewed and are negative.   Physical Exam Updated Vital Signs BP 138/82 (BP Location: Left Arm)   Pulse 89   Temp  98.3 F (36.8 C) (Oral)   Resp 18   Ht 5\' 5"  (1.651 m)   Wt (!) 167.8 kg   SpO2 100%   BMI 61.57 kg/m  Physical Exam Vitals and nursing note reviewed. Exam conducted with a chaperone present Carlena Sax, Charity fundraiser).  Constitutional:      General: She is not in acute distress.    Appearance: Normal appearance. She is normal weight. She is not ill-appearing.  HENT:     Head: Normocephalic and atraumatic.  Pulmonary:     Effort: Pulmonary effort is normal. No respiratory distress.  Abdominal:     General: Abdomen is flat.  Genitourinary:      Comments: Small nonthrombosed external hemorrhoid at the 6 o'clock position.  No obvious masses palpated on DRE.  Observed stool was brown.  No fissures. No active bleeding Musculoskeletal:        General: Normal range of motion.     Cervical back: Neck supple.  Skin:    General: Skin is warm and dry.  Neurological:     Mental Status: She is alert and oriented to person, place, and time.  Psychiatric:        Mood and Affect: Mood normal.        Behavior: Behavior normal.     ED Results / Procedures / Treatments  Labs (all labs ordered are listed, but only abnormal results are displayed) Labs Reviewed  COMPREHENSIVE METABOLIC PANEL - Abnormal; Notable for the following components:      Result Value   Sodium 134 (*)    All other components within normal limits  CBC - Abnormal; Notable for the following components:   Hemoglobin 11.8 (*)    All other components within normal limits  POC OCCULT BLOOD, ED - Abnormal; Notable for the following components:   Fecal Occult Bld POSITIVE (*)    All other components within normal limits    EKG None  Radiology No results found.  Procedures Procedures    Medications Ordered in ED Medications - No data to display  ED Course/ Medical Decision Making/ A&P                                 Medical Decision Making Amount and/or Complexity of Data Reviewed Labs: ordered.   This patient  presents to the ED for concern of rectal bleeding, this involves an extensive number of treatment options, and is a complaint that carries with it a high risk of complications and morbidity. The differential diagnosis for lower GI bleed includes but is not limited to high flow upper GI bleed, diverticulosis, vascular ectasia/AVM, inflammatory bowel disease, infectious colitis, mesenteric ischemia or ischemic colitis,  colorectal cancer or polyps, internal hemorrhoids, aortoenteric fistula, rectal foreign body, rectal ulceration or anal fissure.   My initial workup includes  Additional history obtained from: Nursing notes from this visit. Previous records within EMR system colonoscopy on 06/19/2017 with Dr. Pati Gallo.  He noted multiple small internal hemorrhoids.  No active bleeding at that time.  I ordered, reviewed and interpreted labs which include: CBC, CMP, Hemoccult.  Mild hyponatremia 134.  Borderline anemia of 11.8 at baseline.  Hemoccult positive.  Afebrile, hemodynamically stable.  42 year old female presenting for evaluation of reported blood per rectum.  This occurred after bowel movements both last night and this morning.  The bleeding has improved some.  She denies any change in bowel habits, anorexia, weight loss.  Had a reassuring colonoscopy in 2019 but did show internal hemorrhoids.  Denies any abdominal pain.  Overall suspect the bleeding to be secondary to the hemorrhoids versus a diverticular bleed.  She denies any abdominal pain, fevers, vomiting and low suspicion for diverticulitis..  She is not on any anticoagulation.  Physical exam was reassuring.  She was encouraged to follow-up with her PCP in 1 week for reevaluation.  She was encouraged to return to the emergency department if she develops any new or worsening symptoms.  Stable at discharge.  At this time there does not appear to be any evidence of an acute emergency medical condition and the patient appears stable for discharge  with appropriate outpatient follow up. Diagnosis was discussed with patient who verbalizes understanding of care plan and is agreeable to discharge. I have discussed return precautions with patient who verbalizes understanding. Patient encouraged to follow-up with their PCP within 1 week. All questions answered.  Note: Portions of this report may have been transcribed using voice recognition software. Every effort was made to ensure accuracy; however, inadvertent computerized transcription errors may still be present.        Final Clinical Impression(s) / ED Diagnoses Final diagnoses:  Rectal bleeding    Rx / DC Orders ED Discharge Orders     None  Michelle Piper, PA-C 01/27/23 1128    Virgina Norfolk, DO 01/27/23 1415

## 2023-01-27 NOTE — ED Notes (Signed)
ED Provider at bedside. 

## 2023-03-29 ENCOUNTER — Other Ambulatory Visit: Payer: Self-pay | Admitting: Gastroenterology

## 2023-03-29 DIAGNOSIS — D649 Anemia, unspecified: Secondary | ICD-10-CM

## 2023-04-23 ENCOUNTER — Ambulatory Visit
Admission: RE | Admit: 2023-04-23 | Discharge: 2023-04-23 | Disposition: A | Discharge status: Home / Self Care | Payer: BC Managed Care – PPO | Source: Ambulatory Visit | Attending: Gastroenterology | Admitting: Gastroenterology

## 2023-04-23 DIAGNOSIS — D649 Anemia, unspecified: Secondary | ICD-10-CM

## 2023-04-23 MED ORDER — IOPAMIDOL (ISOVUE-300) INJECTION 61%
100.0000 mL | Freq: Once | INTRAVENOUS | Status: AC | PRN
  Administered 2023-04-23: 100 mL via INTRAVENOUS

## 2023-12-18 ENCOUNTER — Encounter (INDEPENDENT_AMBULATORY_CARE_PROVIDER_SITE_OTHER): Payer: Self-pay

## 2024-02-05 ENCOUNTER — Ambulatory Visit (INDEPENDENT_AMBULATORY_CARE_PROVIDER_SITE_OTHER): Admitting: Otolaryngology

## 2024-02-05 ENCOUNTER — Encounter (INDEPENDENT_AMBULATORY_CARE_PROVIDER_SITE_OTHER): Payer: Self-pay | Admitting: Otolaryngology

## 2024-02-05 VITALS — BP 150/90 | HR 77 | Ht 65.0 in | Wt 370.0 lb

## 2024-02-05 DIAGNOSIS — K219 Gastro-esophageal reflux disease without esophagitis: Secondary | ICD-10-CM

## 2024-02-05 DIAGNOSIS — R490 Dysphonia: Secondary | ICD-10-CM

## 2024-02-05 MED ORDER — FAMOTIDINE 20 MG PO TABS: 20.0000 mg | ORAL_TABLET | Freq: Two times a day (BID) | ORAL | 2 refills | Status: AC

## 2024-02-05 NOTE — Progress Notes (Signed)
 Dear Dr. Verdia, Here is my assessment for our mutual Cheyenne Lyons, Cheyenne Lyons. Thank you for allowing me the opportunity to care for your Cheyenne Lyons. Please do not hesitate to contact me should you have any other questions. Sincerely, Dr. Eldora Blanch  Otolaryngology Clinic Note Referring provider: Dr. Verdia HPI:  Initial visit (01/2024) Discussed the use of AI scribe software for clinical note transcription with the Cheyenne Lyons, who gave verbal consent to proceed.  History of Present Illness Cheyenne Lyons is a 43 year old female who presents with persistent hoarseness.  Hoarseness began in the spring, with intermittent voice loss and occasional normal speech. Improvement noted over the last month, but intermittent voice loss persists. No current medication for hoarseness. Rest helps, voice use made it worse. Previously took reflux medication briefly and prednisone for knee issues. No symptoms of reflux such as heartburn or belching. No recent intubations.  Suspected recent development of allergies, but no formal diagnosis. No nasal symptoms, congestion, or frequent sinus issues. No dysphagia, hemoptysis, unintentional weight loss, or recent illness. A dry cough was present but has resolved.  Home remedies include throat tea, humidifier, and vaporizer, with rest and relaxation improving her voice. Works in Clinical biochemist, requiring some voice use, but can limit talking if necessary.  Hydration suboptimal.     ENT Surgery: no Personal or FHx of bleeding dz or anesthesia difficulty: no  GLP-1: no AP/AC: no  Tobacco: former, quit.  PMHx: OA, HTN  Independent Review of Additional Tests or Records:  Dr. Verdia Referral notes reviewed and uploaded or available in chart in media tab (12/18/2023): noted hoarseness for 2.5 months, comes and goes. No sore throat, no heartburn or reflux symptoms. Dx: Hoarseness; Rx: PPI, ref to ENT, also given pred for knee pain at the time Labs  notes reviewed and uploaded or available in chart in media tab (02/2023) - Bun?Cr 16/0.92; CBC 03/07/2023: WBC 6.2; TSH wnl 03/07/2023  PMH/Meds/All/SocHx/FamHx/ROS:   Past Medical History:  Diagnosis Date   Anemia    Arthritis    GERD (gastroesophageal reflux disease)    with pregnancy   Hypertension    Pneumonia    Pre-diabetes    Pulmonary edema    after delivery on last child   Tendonitis    right thumb     Past Surgical History:  Procedure Laterality Date   CESAREAN SECTION     x2   CESAREAN SECTION WITH BILATERAL TUBAL LIGATION Bilateral 06/11/2013   Procedure: CESAREAN SECTION WITH BILATERAL TUBAL LIGATION;  Surgeon: Rosaline DELENA Luna, MD;  Location: [facility];  Service: Obstetrics;  Laterality: Bilateral;   CLOSED REDUCTION FINGER FRACTURE     COLONOSCOPY N/A 06/19/2017   Procedure: COLONOSCOPY;  Surgeon: Saintclair Jasper, MD;  Location: [facility];  Service: Gastroenterology;  Laterality: N/A;   HYSTEROSCOPY WITH NOVASURE N/A 01/06/2015   Procedure: HYSTEROSCOPY WITH NOVASURE;  Surgeon: Rosaline Luna, MD;  Location: [facility];  Service: Gynecology;  Laterality: N/A;   MASS EXCISION N/A 01/06/2015   Procedure: EXCISION OF INCISION ABDONIMAL NODULE;  Surgeon: Rosaline Luna, MD;  Location: [facility];  Service: Gynecology;  Laterality: N/A;   ROBOTIC ASSISTED TOTAL HYSTERECTOMY WITH BILATERAL SALPINGO OOPHERECTOMY N/A 07/15/2021   Procedure: XI ROBOTIC ASSISTED HYSTERECTOMY WITH BILATERAL SALPINGECTOMY;  Surgeon: Luna Rosaline, MD;  Location: [facility];  Service: Gynecology;  Laterality: N/A;    Family History  Problem Relation Age of Onset   Hyperlipidemia Mother    Hypertension Mother    Cancer Maternal Aunt  ovarian   Cancer Maternal Uncle        throat   Hypertension Maternal Aunt    Hypertension Maternal Grandmother    Breast cancer Maternal Grandmother        over 7    Hypertension Paternal Grandmother    Heart attack Maternal Uncle 50   Stroke Neg Hx       Social Connections: Unknown (09/06/2021)   Received from [facility]   Social Network    Social Network: Not on file      Current Outpatient Medications:    Aspirin-Caffeine (BC FAST PAIN RELIEF PO), Take 2 packets by mouth daily as needed (pain)., Disp: , Rfl:    diclofenac  (VOLTAREN ) 75 MG EC tablet, Take 1 tablet (75 mg total) by mouth 2 (two) times daily., Disp: 50 tablet, Rfl: 2   Menthol -Methyl Salicylate (SALONPAS PAIN RELIEF PATCH) PTCH, Apply 2 patches topically daily as needed (pain)., Disp: , Rfl:    phenazopyridine (PYRIDIUM) 100 MG tablet, Take 100 mg by mouth every 8 (eight) hours as needed for pain., Disp: , Rfl:    Physical Exam:   BP (!) 150/90 (BP Location: Left Arm, Cuff Size: Large)   Pulse 77   Ht 5' 5 (1.651 m)   Wt (!) 370 lb (167.8 kg)   SpO2 94%   BMI 61.57 kg/m   Salient findings:  CN II-XII intact Bilateral EAC clear and TM intact with well pneumatized middle ear spaces Anterior rhinoscopy: Septum inTACT; bilateral inferior turbinates without significant hypertrophy No lesions of oral cavity/oropharynx No obviously palpable neck masses/lymphadenopathy/thyromegaly No respiratory distress or stridor; voice quality class 1.5; TFL was indicated to better evaluate the proximal airway, given the Cheyenne Lyons's history and exam findings, and is detailed below.  Seprately Identifiable Procedures:  Prior to initiating any procedures, risks/benefits/alternatives were explained to the Cheyenne Lyons and verbal consent obtained. Procedure Note Pre-procedure diagnosis:  Dysphonia Post-procedure diagnosis: Same Procedure: Transnasal Fiberoptic Laryngoscopy, CPT 31575 - Mod 25 Indication: see above Complications: None apparent EBL: 0 mL  The procedure was undertaken to further evaluate the Cheyenne Lyons's complaint of 0, with mirror exam inadequate for appropriate examination due to gag reflex and poor Cheyenne Lyons tolerance  Procedure:  Cheyenne Lyons was identified as correct  Cheyenne Lyons. Verbal consent was obtained. The nose was sprayed with oxymetazoline and 4% lidocaine . The The flexible laryngoscope was passed through the nose to view the nasal cavity, pharynx (oropharynx, hypopharynx) and larynx.  The larynx was examined at rest and during multiple phonatory tasks. Documentation was obtained and reviewed with Cheyenne Lyons. The scope was removed. The Cheyenne Lyons tolerated the procedure well.  Findings: The nasal cavity and nasopharynx did not reveal any masses or lesions, mucosa appeared to be without obvious lesions. The tongue base, pharyngeal walls, piriform sinuses, vallecula, epiglottis and postcricoid region are normal in appearance. The visualized portion of the subglottis and proximal trachea is widely patent. The vocal folds are mobile bilaterally. There are no lesions on the free edge of the vocal folds nor elsewhere in the larynx worrisome for malignancy.  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 female with:  1. Dysphonia   2. Laryngopharyngeal reflux (LPR)   3. Gastroesophageal reflux disease, unspecified whether esophagitis present    Given improvement with PPI, suspect possibly LPR; Dysphonia intermittent but persistent, and suspect  MTD may also play a role. She is overall doing ok right now so would recommend conservative measures given recent improvement  ecommend Reflux Gourmet (seaweed-based) for silent reflux, available on [employer], to be used after meals. - Advise staying hydrated to prevent throat dryness and hoarseness. - Prescribe Pepcid at night if hoarseness worsens. - Consider referral to voice therapy if symptoms persist or worsen.  - f/u if not improving  See below regarding exact medications prescribed this encounter including dosages and route: No  orders of the defined types were placed in this encounter.     Thank you for allowing me the opportunity to care for your Cheyenne Lyons. Please do not hesitate to contact me should you have any other questions.  Sincerely, Eldora Blanch, MD Otolaryngologist (ENT), [facility] Phone: [phone] Fax: [phone]  02/05/2024, 8:08 AM   MDM:  Level 4 - [phone] Complexity/Problems addressed: mod - chronic problems Data complexity: mod - independent review of note, labs - Morbidity: mod  - Drug prescribed or managed: y
